# Patient Record
Sex: Female | Born: 1960 | Race: Black or African American | Hispanic: No | Marital: Single | Smoking: Former smoker
Health system: Southern US, Community
[De-identification: ages and names within clinical notes are randomized; demographics above are authoritative.]

## PROBLEM LIST (undated history)

## (undated) DIAGNOSIS — Z9889 Other specified postprocedural states: Secondary | ICD-10-CM

## (undated) DIAGNOSIS — I2699 Other pulmonary embolism without acute cor pulmonale: Secondary | ICD-10-CM

## (undated) DIAGNOSIS — D219 Benign neoplasm of connective and other soft tissue, unspecified: Secondary | ICD-10-CM

## (undated) DIAGNOSIS — R112 Nausea with vomiting, unspecified: Secondary | ICD-10-CM

## (undated) DIAGNOSIS — E785 Hyperlipidemia, unspecified: Secondary | ICD-10-CM

## (undated) DIAGNOSIS — O009 Unspecified ectopic pregnancy without intrauterine pregnancy: Secondary | ICD-10-CM

## (undated) DIAGNOSIS — I739 Peripheral vascular disease, unspecified: Secondary | ICD-10-CM

## (undated) DIAGNOSIS — I82409 Acute embolism and thrombosis of unspecified deep veins of unspecified lower extremity: Secondary | ICD-10-CM

## (undated) DIAGNOSIS — T8859XA Other complications of anesthesia, initial encounter: Secondary | ICD-10-CM

## (undated) DIAGNOSIS — K429 Umbilical hernia without obstruction or gangrene: Secondary | ICD-10-CM

## (undated) DIAGNOSIS — J189 Pneumonia, unspecified organism: Secondary | ICD-10-CM

## (undated) DIAGNOSIS — D689 Coagulation defect, unspecified: Secondary | ICD-10-CM

## (undated) DIAGNOSIS — M1990 Unspecified osteoarthritis, unspecified site: Secondary | ICD-10-CM

## (undated) HISTORY — PX: OTHER SURGICAL HISTORY: SHX169

## (undated) HISTORY — DX: Hyperlipidemia, unspecified: E78.5

## (undated) HISTORY — DX: Coagulation defect, unspecified: D68.9

## (undated) HISTORY — PX: DIAGNOSTIC LAPAROSCOPY: SUR761

## (undated) HISTORY — PX: WISDOM TOOTH EXTRACTION: SHX21

## (undated) HISTORY — PX: EYE SURGERY: SHX253

---

## 1997-07-17 ENCOUNTER — Emergency Department (HOSPITAL_COMMUNITY): Admission: EM | Admit: 1997-07-17 | Discharge: 1997-07-17 | Payer: Self-pay | Admitting: *Deleted

## 1998-01-13 HISTORY — PX: KNEE SURGERY: SHX244

## 2001-05-25 ENCOUNTER — Encounter: Payer: Self-pay | Admitting: Internal Medicine

## 2001-05-25 ENCOUNTER — Encounter: Admission: RE | Admit: 2001-05-25 | Discharge: 2001-05-25 | Payer: Self-pay | Admitting: Internal Medicine

## 2002-04-24 ENCOUNTER — Emergency Department (HOSPITAL_COMMUNITY): Admission: EM | Admit: 2002-04-24 | Discharge: 2002-04-24 | Payer: Self-pay | Admitting: Emergency Medicine

## 2002-05-28 ENCOUNTER — Ambulatory Visit (HOSPITAL_COMMUNITY): Admission: RE | Admit: 2002-05-28 | Discharge: 2002-05-28 | Payer: Self-pay | Admitting: Family Medicine

## 2002-09-05 ENCOUNTER — Encounter: Admission: RE | Admit: 2002-09-05 | Discharge: 2002-09-05 | Payer: Self-pay | Admitting: Internal Medicine

## 2002-09-05 ENCOUNTER — Encounter: Payer: Self-pay | Admitting: Internal Medicine

## 2003-08-21 ENCOUNTER — Other Ambulatory Visit: Admission: RE | Admit: 2003-08-21 | Discharge: 2003-08-21 | Payer: Self-pay | Admitting: Obstetrics & Gynecology

## 2003-09-11 ENCOUNTER — Encounter: Admission: RE | Admit: 2003-09-11 | Discharge: 2003-09-11 | Payer: Self-pay | Admitting: Obstetrics & Gynecology

## 2003-09-11 ENCOUNTER — Encounter (INDEPENDENT_AMBULATORY_CARE_PROVIDER_SITE_OTHER): Payer: Self-pay | Admitting: *Deleted

## 2004-09-18 ENCOUNTER — Other Ambulatory Visit: Admission: RE | Admit: 2004-09-18 | Discharge: 2004-09-18 | Payer: Self-pay | Admitting: Obstetrics & Gynecology

## 2004-11-12 ENCOUNTER — Ambulatory Visit: Payer: Self-pay | Admitting: Internal Medicine

## 2005-01-22 ENCOUNTER — Ambulatory Visit: Payer: Self-pay | Admitting: Internal Medicine

## 2005-06-23 ENCOUNTER — Ambulatory Visit: Payer: Self-pay | Admitting: Internal Medicine

## 2005-06-24 ENCOUNTER — Ambulatory Visit: Payer: Self-pay | Admitting: Internal Medicine

## 2005-09-05 ENCOUNTER — Ambulatory Visit: Payer: Self-pay | Admitting: Oncology

## 2005-09-10 LAB — LUPUS ANTICOAGULANT PANEL: PTT Lupus Anticoagulant: 42.2 secs (ref 36.3–48.8)

## 2005-09-10 LAB — BETA-2 GLYCOPROTEIN ANTIBODIES
Beta-2-Glycoprotein I IgA: <4 U/mL (ref <10)
Beta-2-Glycoprotein I IgM: 7 U/mL (ref <10)

## 2005-09-10 LAB — CARDIOLIPIN ANTIBODIES, IGG, IGM, IGA
Anticardiolipin IgG: <7 [GPL'U] (ref <11)
Anticardiolipin IgM: 9 [MPL'U] (ref <10)

## 2005-09-10 LAB — D-DIMER, QUANTITATIVE: D-Dimer, Quant: 1.22 ug/mL-FEU — ABNORMAL HIGH (ref 0.00–0.48)

## 2005-11-06 ENCOUNTER — Ambulatory Visit: Payer: Self-pay | Admitting: Oncology

## 2005-11-19 ENCOUNTER — Encounter: Admission: RE | Admit: 2005-11-19 | Discharge: 2005-11-19 | Payer: Self-pay | Admitting: General Surgery

## 2006-01-02 ENCOUNTER — Ambulatory Visit (HOSPITAL_BASED_OUTPATIENT_CLINIC_OR_DEPARTMENT_OTHER): Admission: RE | Admit: 2006-01-02 | Discharge: 2006-01-02 | Payer: Self-pay | Admitting: Orthopedic Surgery

## 2006-07-08 ENCOUNTER — Encounter (INDEPENDENT_AMBULATORY_CARE_PROVIDER_SITE_OTHER): Payer: Self-pay | Admitting: Orthopedic Surgery

## 2006-07-08 ENCOUNTER — Ambulatory Visit (HOSPITAL_COMMUNITY): Admission: RE | Admit: 2006-07-08 | Discharge: 2006-07-08 | Payer: Self-pay | Admitting: Orthopedic Surgery

## 2006-07-08 ENCOUNTER — Ambulatory Visit: Payer: Self-pay | Admitting: Vascular Surgery

## 2006-07-28 ENCOUNTER — Emergency Department (HOSPITAL_COMMUNITY): Admission: EM | Admit: 2006-07-28 | Discharge: 2006-07-28 | Payer: Self-pay | Admitting: Emergency Medicine

## 2006-09-05 ENCOUNTER — Ambulatory Visit: Payer: Self-pay | Admitting: Pulmonary Disease

## 2006-09-05 ENCOUNTER — Ambulatory Visit: Payer: Self-pay | Admitting: Internal Medicine

## 2006-09-05 ENCOUNTER — Inpatient Hospital Stay (HOSPITAL_COMMUNITY): Admission: EM | Admit: 2006-09-05 | Discharge: 2006-09-16 | Payer: Self-pay | Admitting: Emergency Medicine

## 2006-09-05 ENCOUNTER — Ambulatory Visit: Payer: Self-pay | Admitting: Cardiology

## 2006-09-05 DIAGNOSIS — I2699 Other pulmonary embolism without acute cor pulmonale: Secondary | ICD-10-CM

## 2006-09-07 ENCOUNTER — Encounter: Payer: Self-pay | Admitting: Internal Medicine

## 2006-09-07 ENCOUNTER — Ambulatory Visit: Payer: Self-pay | Admitting: *Deleted

## 2006-09-08 ENCOUNTER — Encounter: Payer: Self-pay | Admitting: Internal Medicine

## 2006-09-14 LAB — CONVERTED CEMR LAB: Pap Smear: NORMAL

## 2006-09-15 ENCOUNTER — Telehealth (INDEPENDENT_AMBULATORY_CARE_PROVIDER_SITE_OTHER): Payer: Self-pay | Admitting: *Deleted

## 2006-09-18 ENCOUNTER — Ambulatory Visit: Payer: Self-pay | Admitting: Internal Medicine

## 2006-09-21 ENCOUNTER — Encounter: Payer: Self-pay | Admitting: Internal Medicine

## 2006-09-21 ENCOUNTER — Ambulatory Visit: Payer: Self-pay | Admitting: Cardiology

## 2006-09-28 ENCOUNTER — Ambulatory Visit: Payer: Self-pay | Admitting: Internal Medicine

## 2006-09-30 ENCOUNTER — Encounter (INDEPENDENT_AMBULATORY_CARE_PROVIDER_SITE_OTHER): Payer: Self-pay | Admitting: *Deleted

## 2006-09-30 ENCOUNTER — Ambulatory Visit: Payer: Self-pay | Admitting: Internal Medicine

## 2006-09-30 DIAGNOSIS — E669 Obesity, unspecified: Secondary | ICD-10-CM

## 2006-09-30 DIAGNOSIS — I10 Essential (primary) hypertension: Secondary | ICD-10-CM | POA: Insufficient documentation

## 2006-09-30 DIAGNOSIS — F411 Generalized anxiety disorder: Secondary | ICD-10-CM | POA: Insufficient documentation

## 2006-09-30 LAB — CONVERTED CEMR LAB
Hemoglobin: 12.3 g/dL
INR: 2.5
Prothrombin Time: 19 s

## 2006-10-01 ENCOUNTER — Ambulatory Visit: Payer: Self-pay | Admitting: Internal Medicine

## 2006-10-02 ENCOUNTER — Telehealth (INDEPENDENT_AMBULATORY_CARE_PROVIDER_SITE_OTHER): Payer: Self-pay | Admitting: *Deleted

## 2006-10-05 ENCOUNTER — Ambulatory Visit: Payer: Self-pay | Admitting: Internal Medicine

## 2006-10-08 ENCOUNTER — Ambulatory Visit: Payer: Self-pay | Admitting: Internal Medicine

## 2006-10-08 ENCOUNTER — Encounter (INDEPENDENT_AMBULATORY_CARE_PROVIDER_SITE_OTHER): Payer: Self-pay | Admitting: *Deleted

## 2006-10-09 ENCOUNTER — Ambulatory Visit: Payer: Self-pay | Admitting: Internal Medicine

## 2006-10-12 ENCOUNTER — Encounter: Payer: Self-pay | Admitting: Internal Medicine

## 2006-10-15 ENCOUNTER — Ambulatory Visit: Payer: Self-pay | Admitting: Cardiology

## 2006-10-23 ENCOUNTER — Ambulatory Visit: Payer: Self-pay | Admitting: Cardiology

## 2006-10-29 ENCOUNTER — Ambulatory Visit: Payer: Self-pay | Admitting: Cardiology

## 2006-11-04 ENCOUNTER — Encounter: Payer: Self-pay | Admitting: Internal Medicine

## 2006-11-06 ENCOUNTER — Telehealth: Payer: Self-pay | Admitting: Internal Medicine

## 2006-11-06 ENCOUNTER — Ambulatory Visit: Payer: Self-pay | Admitting: Internal Medicine

## 2006-11-06 DIAGNOSIS — G4700 Insomnia, unspecified: Secondary | ICD-10-CM | POA: Insufficient documentation

## 2006-11-06 DIAGNOSIS — J309 Allergic rhinitis, unspecified: Secondary | ICD-10-CM | POA: Insufficient documentation

## 2006-11-12 ENCOUNTER — Ambulatory Visit: Payer: Self-pay | Admitting: Internal Medicine

## 2006-11-26 ENCOUNTER — Ambulatory Visit: Payer: Self-pay | Admitting: Internal Medicine

## 2006-11-27 LAB — CONVERTED CEMR LAB
ALT: 21 units/L (ref 0–35)
BUN: 12 mg/dL (ref 6–23)
Basophils Absolute: 0 10*3/uL (ref 0.0–0.1)
Basophils Relative: 0.1 % (ref 0.0–1.0)
Bilirubin, Direct: 0.1 mg/dL (ref 0.0–0.3)
CO2: 25 meq/L (ref 19–32)
Chloride: 108 meq/L (ref 96–112)
Creatinine, Ser: 0.8 mg/dL (ref 0.4–1.2)
Eosinophils Absolute: 0.2 10*3/uL (ref 0.0–0.6)
GFR calc Af Amer: 100 mL/min
Glucose, Bld: 81 mg/dL (ref 70–99)
Hemoglobin: 12.2 g/dL (ref 12.0–15.0)
Lipase: 18 units/L (ref 11.0–59.0)
MCHC: 34.1 g/dL (ref 30.0–36.0)
Monocytes Absolute: 0.3 10*3/uL (ref 0.2–0.7)
Monocytes Relative: 6.1 % (ref 3.0–11.0)
Neutro Abs: 2.4 10*3/uL (ref 1.4–7.7)
Potassium: 3.9 meq/L (ref 3.5–5.1)
RDW: 14.3 % (ref 11.5–14.6)
Total Bilirubin: 0.6 mg/dL (ref 0.3–1.2)
Total CHOL/HDL Ratio: 3.4
Total Protein: 7.7 g/dL (ref 6.0–8.3)

## 2006-12-03 ENCOUNTER — Ambulatory Visit: Payer: Self-pay | Admitting: Cardiology

## 2006-12-14 ENCOUNTER — Telehealth (INDEPENDENT_AMBULATORY_CARE_PROVIDER_SITE_OTHER): Payer: Self-pay | Admitting: *Deleted

## 2006-12-16 ENCOUNTER — Encounter (INDEPENDENT_AMBULATORY_CARE_PROVIDER_SITE_OTHER): Payer: Self-pay | Admitting: *Deleted

## 2007-01-01 ENCOUNTER — Telehealth: Payer: Self-pay | Admitting: Internal Medicine

## 2007-01-04 ENCOUNTER — Encounter (INDEPENDENT_AMBULATORY_CARE_PROVIDER_SITE_OTHER): Payer: Self-pay | Admitting: *Deleted

## 2007-01-18 ENCOUNTER — Ambulatory Visit: Payer: Self-pay | Admitting: Cardiology

## 2007-01-27 ENCOUNTER — Emergency Department (HOSPITAL_COMMUNITY): Admission: EM | Admit: 2007-01-27 | Discharge: 2007-01-28 | Payer: Self-pay | Admitting: Emergency Medicine

## 2007-02-08 ENCOUNTER — Ambulatory Visit: Payer: Self-pay | Admitting: Cardiology

## 2007-03-08 ENCOUNTER — Ambulatory Visit: Payer: Self-pay | Admitting: Cardiology

## 2007-03-22 ENCOUNTER — Ambulatory Visit: Payer: Self-pay | Admitting: Cardiology

## 2007-03-29 ENCOUNTER — Ambulatory Visit: Payer: Self-pay | Admitting: Internal Medicine

## 2007-04-12 ENCOUNTER — Ambulatory Visit: Payer: Self-pay | Admitting: Cardiology

## 2007-05-03 ENCOUNTER — Ambulatory Visit: Payer: Self-pay | Admitting: Cardiology

## 2007-07-27 ENCOUNTER — Ambulatory Visit: Payer: Self-pay | Admitting: Internal Medicine

## 2007-08-03 ENCOUNTER — Ambulatory Visit: Payer: Self-pay | Admitting: Cardiology

## 2007-08-18 ENCOUNTER — Ambulatory Visit: Payer: Self-pay | Admitting: Cardiology

## 2007-08-18 ENCOUNTER — Ambulatory Visit: Payer: Self-pay | Admitting: Internal Medicine

## 2007-09-03 ENCOUNTER — Ambulatory Visit: Payer: Self-pay | Admitting: Internal Medicine

## 2007-09-17 ENCOUNTER — Ambulatory Visit: Payer: Self-pay | Admitting: Cardiovascular Disease

## 2007-10-11 ENCOUNTER — Ambulatory Visit: Payer: Self-pay | Admitting: Cardiovascular Disease

## 2007-10-19 ENCOUNTER — Ambulatory Visit: Payer: Self-pay | Admitting: Cardiology

## 2007-11-04 IMAGING — CR DG CHEST 2V
2 series · 2 of 2 positions shown · non-contrast
Comparison: 09/11/06.

CLINICAL DATA: History of pulmonary embolism. Upper respiratory infection.
 CHEST - 2 VIEW:

[view not recorded (1 of 2)]
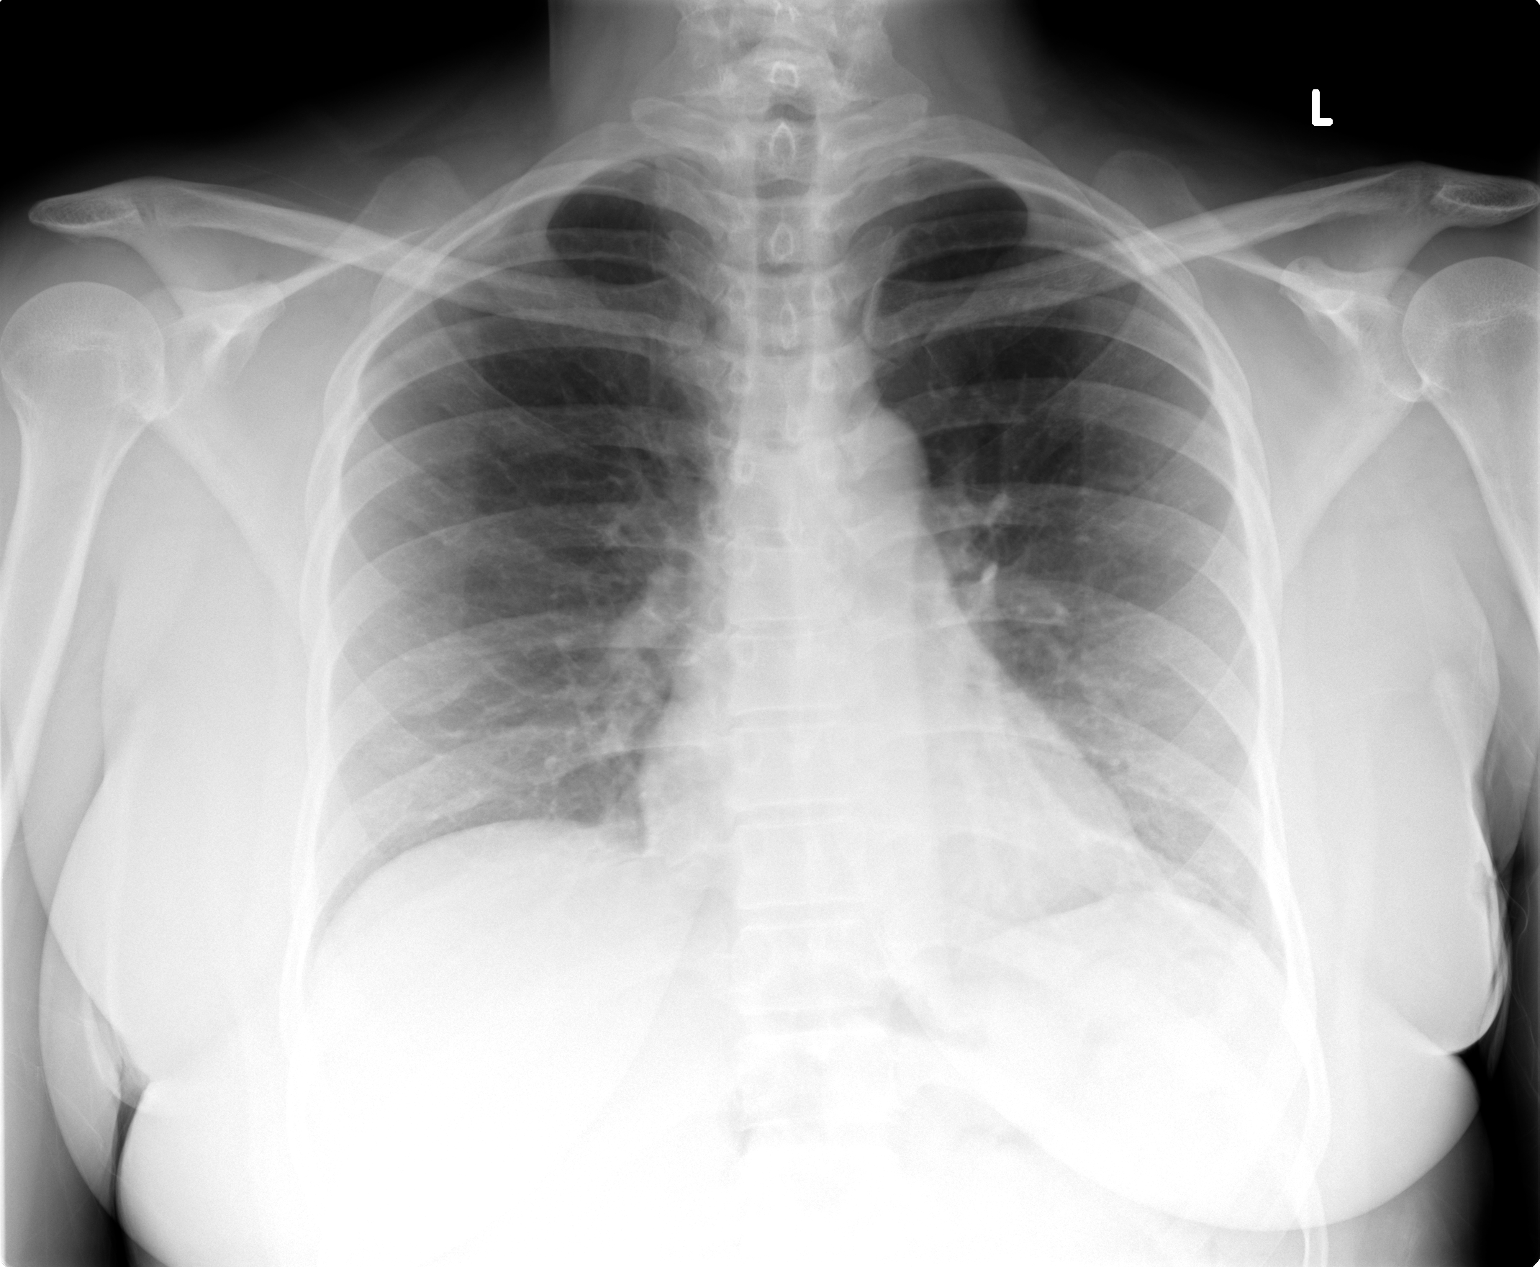

[view not recorded (2 of 2)]
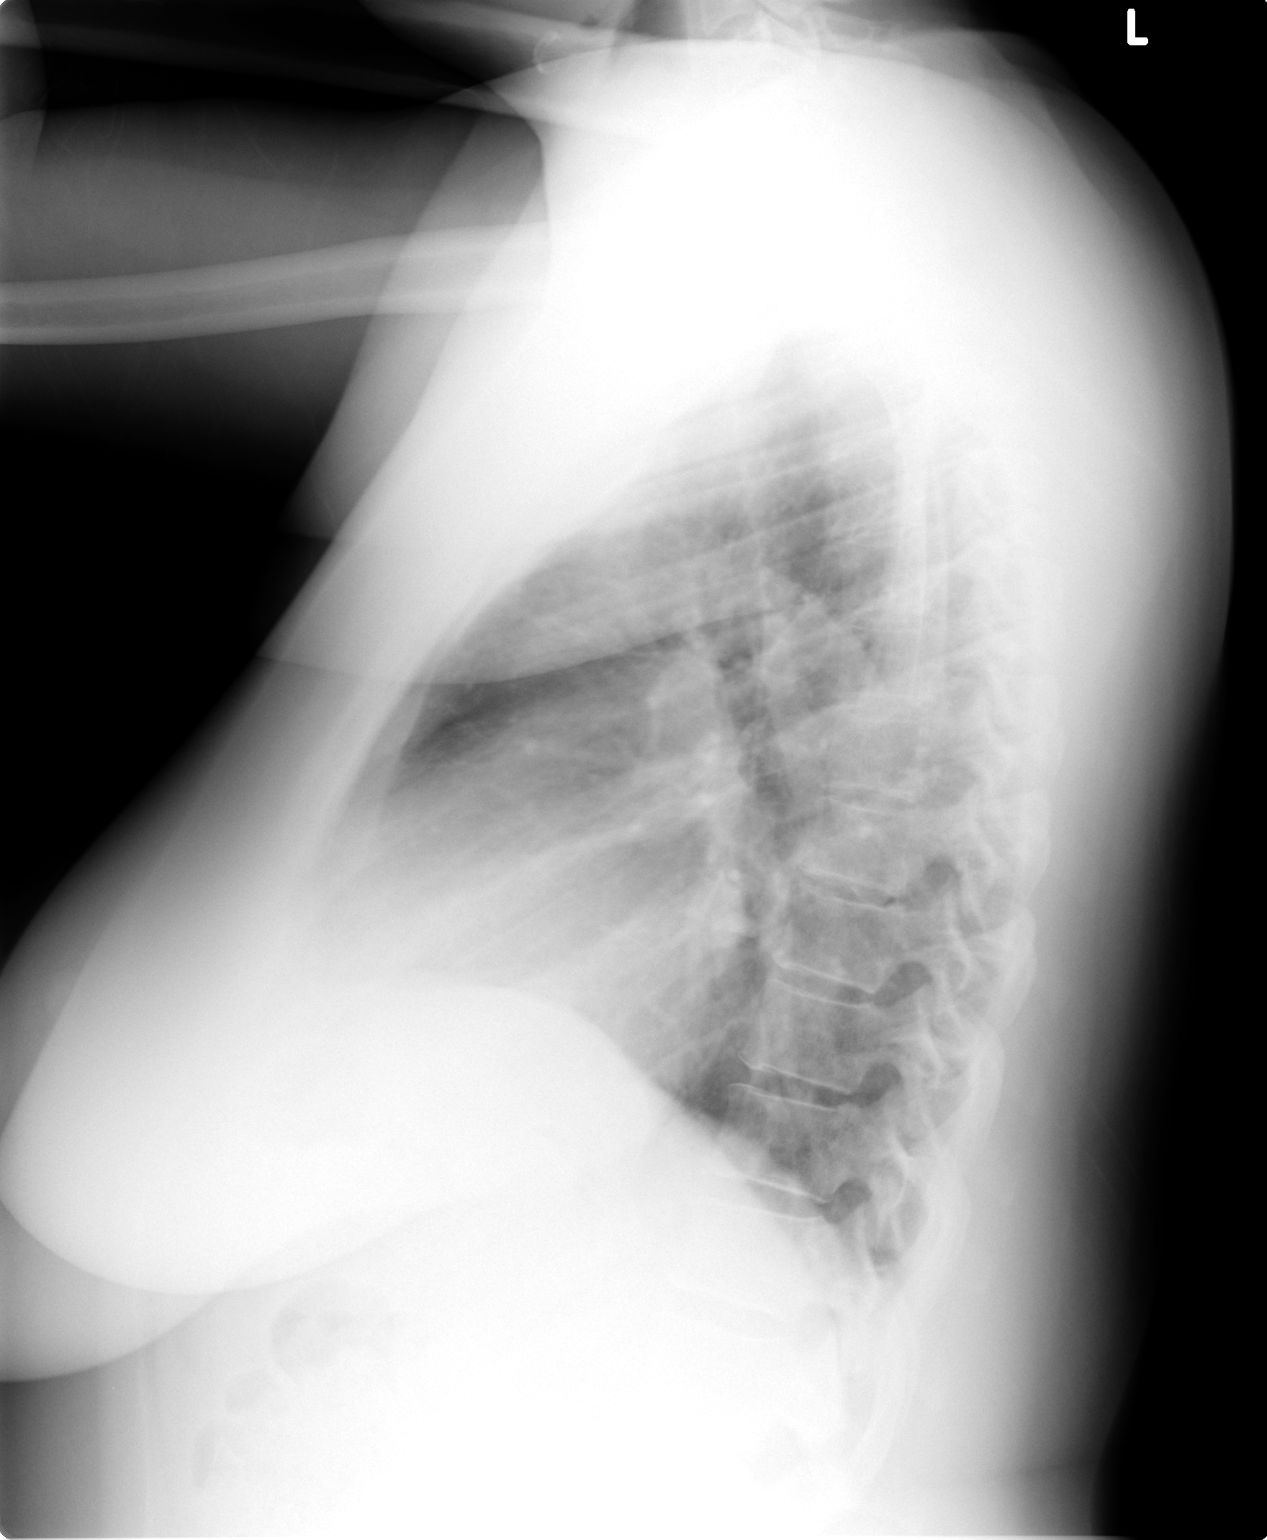

[2 of 2 positions shown; findings below may reference images not displayed]

FINDINGS: No active infiltrate or effusion is seen.  The heart is within normal limits in size. No bony abnormality is seen.
IMPRESSION: No active lung disease.

## 2007-11-15 ENCOUNTER — Ambulatory Visit: Payer: Self-pay | Admitting: Cardiovascular Disease

## 2007-12-06 ENCOUNTER — Ambulatory Visit: Payer: Self-pay | Admitting: Internal Medicine

## 2007-12-13 ENCOUNTER — Ambulatory Visit: Payer: Self-pay | Admitting: Internal Medicine

## 2007-12-16 ENCOUNTER — Ambulatory Visit: Payer: Self-pay | Admitting: Hematology and Oncology

## 2008-01-03 ENCOUNTER — Ambulatory Visit: Payer: Self-pay | Admitting: Internal Medicine

## 2008-01-06 ENCOUNTER — Encounter (INDEPENDENT_AMBULATORY_CARE_PROVIDER_SITE_OTHER): Payer: Self-pay | Admitting: *Deleted

## 2008-01-06 LAB — CONVERTED CEMR LAB
ALT: 17 units/L (ref 0–35)
AST: 23 units/L (ref 0–37)
BUN: 11 mg/dL (ref 6–23)
Basophils Relative: 1.1 % (ref 0.0–3.0)
CO2: 27 meq/L (ref 19–32)
Chloride: 107 meq/L (ref 96–112)
Cholesterol: 201 mg/dL (ref 0–200)
Direct LDL: 134.1 mg/dL
HCT: 36.8 % (ref 36.0–46.0)
Hemoglobin: 12.4 g/dL (ref 12.0–15.0)
Lymphocytes Relative: 41.7 % (ref 12.0–46.0)
MCHC: 33.6 g/dL (ref 30.0–36.0)
MCV: 91.9 fL (ref 78.0–100.0)
Monocytes Absolute: 0.3 10*3/uL (ref 0.1–1.0)
Neutro Abs: 1.7 10*3/uL (ref 1.4–7.7)
Platelets: 309 10*3/uL (ref 150–400)
Potassium: 4.3 meq/L (ref 3.5–5.1)
RBC: 4.01 M/uL (ref 3.87–5.11)
Sodium: 140 meq/L (ref 135–145)
TSH: 0.7 microintl units/mL (ref 0.35–5.50)
Triglycerides: 41 mg/dL (ref 0–149)
VLDL: 8 mg/dL (ref 0–40)

## 2008-01-10 ENCOUNTER — Ambulatory Visit: Payer: Self-pay | Admitting: Cardiology

## 2008-04-17 ENCOUNTER — Ambulatory Visit: Payer: Self-pay | Admitting: Cardiology

## 2008-05-01 ENCOUNTER — Ambulatory Visit: Payer: Self-pay | Admitting: Cardiovascular Disease

## 2008-05-22 ENCOUNTER — Ambulatory Visit: Payer: Self-pay | Admitting: Cardiovascular Disease

## 2008-05-22 ENCOUNTER — Encounter (INDEPENDENT_AMBULATORY_CARE_PROVIDER_SITE_OTHER): Payer: Self-pay | Admitting: Cardiology

## 2008-05-29 ENCOUNTER — Ambulatory Visit: Payer: Self-pay | Admitting: Cardiovascular Disease

## 2008-06-05 ENCOUNTER — Ambulatory Visit: Payer: Self-pay | Admitting: Cardiovascular Disease

## 2008-06-13 ENCOUNTER — Encounter: Payer: Self-pay | Admitting: *Deleted

## 2008-07-03 ENCOUNTER — Ambulatory Visit: Payer: Self-pay | Admitting: Internal Medicine

## 2008-07-03 LAB — CONVERTED CEMR LAB
POC INR: 1.2
Protime: 13.5

## 2008-07-19 ENCOUNTER — Encounter: Payer: Self-pay | Admitting: *Deleted

## 2008-08-24 ENCOUNTER — Encounter: Payer: Self-pay | Admitting: Cardiology

## 2008-08-29 ENCOUNTER — Encounter (INDEPENDENT_AMBULATORY_CARE_PROVIDER_SITE_OTHER): Payer: Self-pay | Admitting: *Deleted

## 2008-09-29 ENCOUNTER — Encounter (INDEPENDENT_AMBULATORY_CARE_PROVIDER_SITE_OTHER): Payer: Self-pay | Admitting: *Deleted

## 2008-10-03 ENCOUNTER — Telehealth (INDEPENDENT_AMBULATORY_CARE_PROVIDER_SITE_OTHER): Payer: Self-pay | Admitting: *Deleted

## 2008-10-09 ENCOUNTER — Ambulatory Visit: Payer: Self-pay | Admitting: Cardiology

## 2008-10-09 LAB — CONVERTED CEMR LAB: POC INR: 2.3

## 2008-10-19 ENCOUNTER — Encounter: Payer: Self-pay | Admitting: Internal Medicine

## 2009-10-22 ENCOUNTER — Telehealth: Payer: Self-pay | Admitting: Internal Medicine

## 2009-11-05 ENCOUNTER — Encounter (INDEPENDENT_AMBULATORY_CARE_PROVIDER_SITE_OTHER): Payer: Self-pay | Admitting: *Deleted

## 2009-11-05 ENCOUNTER — Ambulatory Visit: Payer: Self-pay | Admitting: Internal Medicine

## 2009-11-05 DIAGNOSIS — L989 Disorder of the skin and subcutaneous tissue, unspecified: Secondary | ICD-10-CM | POA: Insufficient documentation

## 2009-11-12 LAB — CONVERTED CEMR LAB
ALT: 25 units/L (ref 0–35)
Basophils Absolute: 0 10*3/uL (ref 0.0–0.1)
Bilirubin, Direct: 0.2 mg/dL (ref 0.0–0.3)
CO2: 23 meq/L (ref 19–32)
Calcium: 9 mg/dL (ref 8.4–10.5)
Chloride: 106 meq/L (ref 96–112)
Eosinophils Relative: 4.8 % (ref 0.0–5.0)
HDL: 77.2 mg/dL (ref >39.00)
MCV: 96.3 fL (ref 78.0–100.0)
Monocytes Absolute: 0.2 10*3/uL (ref 0.1–1.0)
Monocytes Relative: 5 % (ref 3.0–12.0)
Neutro Abs: 2.1 10*3/uL (ref 1.4–7.7)
Potassium: 4.2 meq/L (ref 3.5–5.1)
RBC: 3.7 M/uL — ABNORMAL LOW (ref 3.87–5.11)
RDW: 14.6 % (ref 11.5–14.6)
TSH: 1.17 microintl units/mL (ref 0.35–5.50)
Total CHOL/HDL Ratio: 3
Total Protein: 7.5 g/dL (ref 6.0–8.3)
Triglycerides: 29 mg/dL (ref 0.0–149.0)
VLDL: 5.8 mg/dL (ref 0.0–40.0)
WBC: 4.5 10*3/uL (ref 4.5–10.5)

## 2009-11-23 ENCOUNTER — Ambulatory Visit: Payer: Self-pay | Admitting: Hematology & Oncology

## 2009-11-26 ENCOUNTER — Encounter: Payer: Self-pay | Admitting: Internal Medicine

## 2009-11-26 LAB — CBC WITH DIFFERENTIAL (CANCER CENTER ONLY)
BASO%: 0.5 % (ref 0.0–2.0)
EOS%: 4.4 % (ref 0.0–7.0)
HCT: 36.6 % (ref 34.8–46.6)
LYMPH#: 2.1 10*3/uL (ref 0.9–3.3)
LYMPH%: 47 % (ref 14.0–48.0)
MCH: 31.1 pg (ref 26.0–34.0)
MONO#: 0.2 10*3/uL (ref 0.1–0.9)
MONO%: 4 % (ref 0.0–13.0)
NEUT#: 2 10*3/uL (ref 1.5–6.5)
RBC: 3.95 10*6/uL (ref 3.70–5.32)

## 2009-12-03 LAB — HYPERCOAGULABLE PANEL, COMPREHENSIVE
Anticardiolipin IgA: 3 APL U/mL (ref <22)
Anticardiolipin IgG: 3 GPL U/mL (ref <23)
Beta-2-Glycoprotein I IgA: 1 A Units (ref <20)
Beta-2-Glycoprotein I IgM: 2 M Units (ref <20)
DRVVT: 40.9 secs (ref 36.2–44.3)
Protein C Activity: 135 % — ABNORMAL HIGH (ref 75–133)
Protein S Activity: 86 % (ref 69–129)

## 2010-02-02 ENCOUNTER — Encounter: Payer: Self-pay | Admitting: Internal Medicine

## 2010-02-14 NOTE — Progress Notes (Signed)
Summary: stopping ASA  Phone Note Call from Patient Call back at [phone] ext [phone]   Caller: [facility]  Summary of Call: Pt is having a Vitrecetomy on Thursday okay to stop ASA?  Faxed order to [phone]. Pt has not been seen since 2009.  Initial call taken by: Army Fossa CMA,  October 22, 2009 11:08 AM  Follow-up for Phone Call        not seen in almost 2 years; I can't answer that Lacassine E. Paz MD  October 22, 2009 1:04 PM   Additional Follow-up for Phone Call Additional follow up Details #1::        Left detailed message for [facility] at Surgical center. Additional Follow-up by: Army Fossa CMA,  October 22, 2009 1:20 PM

## 2010-02-14 NOTE — Consult Note (Signed)
Summary: ok off coumadin   , life long anticoag if another event--hematol  [facility]   Imported By: Lanelle Bal 12/11/2009 12:35:04  _____________________________________________________________________  External Attachment:    Type:   Image     Comment:   External Document

## 2010-02-14 NOTE — Letter (Signed)
Summary: Generic Letter  [facility]  [address] [place], [place] [address]   Phone: [phone]  Fax: [phone]    11/05/2009  [facility] [address] [name], [place]  [address]  To whom it may Concern:  The above patient was here today, November 05, 2009 for her annual complete physical exam. Any further questions please feel free to contact my office.            Sincerely,   Willow Ora, MD

## 2010-02-14 NOTE — Assessment & Plan Note (Signed)
Summary: cpx/cbs   Vital Signs:  Patient profile:   50 year old female Height:      65.75 inches Weight:      228.25 pounds BMI:     37.26 Pulse rate:   90 / minute Pulse rhythm:   regular BP sitting:   128 / 80  (left arm) Cuff size:   large  Vitals Entered By: Army Fossa CMA (November 05, 2009 1:57 PM) CC: CPX, fasting  Comments declines flu shot pharm- [facility] [place] mammo/pap- due    History of Present Illness: CPX  Preventive Screening-Counseling & Management  Caffeine-Diet-Exercise     Does Patient Exercise: no  Current Medications (verified): 1)  Mvi 2)  Bufferin 325 Mg Tabs (Aspirin Buf(Cacarb-Mgcarb-Mgo)) .... Qd 3)  Zylet 0.5-0.3 % Susp (Loteprednol-Tobramycin) .... Three Times A Day  Allergies (verified): 1)  ! 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 CV:  Denies chest pain or discomfort and swelling of feet. Resp:  Denies cough and shortness of breath. GI:  Denies bloody stools, diarrhea, nausea, and vomiting. GU:  Denies dysuria and hematuria. Psych:  Denies anxiety and depression.  Physical Exam  General:  alert, well-developed, and overweight-appearing.   Neck:  no masses, no thyromegaly, and normal carotid upstroke.   Lungs:  normal respiratory  effort, no intercostal retractions, no accessory muscle use, and normal breath sounds.   Heart:  normal rate, regular rhythm, and no murmur.   Abdomen:  soft, non-tender, no distention, no masses, no guarding, and no rigidity.   Extremities:  no  lower extremity edema Neurologic:  alert & oriented X3, strength normal in all extremities, and gait normal.   Skin:  has a half centimeter oval, slightly raised  and hyperchromic lesion in the left leg. Psych:  Oriented X3, not anxious appearing, and not depressed appearing.     Impression & Recommendations:  Problem # 1:  HEALTH SCREENING (ICD-V70.0) Td 08 declined flu shot   sees gyn, PAP, MMG--- per gyn   never Cscope  labs , diet, exercise discussed    Orders: Venipuncture (04540) TLB-BMP (Basic Metabolic Panel-BMET) (80048-METABOL) TLB-CBC Platelet - w/Differential (85025-CBCD) TLB-Hepatic/Liver Function Pnl (80076-HEPATIC) TLB-Lipid Panel (80061-LIPID) TLB-TSH (Thyroid Stimulating Hormone) (84443-TSH) Specimen Handling (98119) Hematology Referral (Hematology)  Problem # 2:  PE (ICD-415.19)  history of PE after bed rest Strong family history of clots  She was prescribed Coumadin but self discontinued due to several symptoms that she felt were related to Coumadin Patient wonders  if she needs coumadin again, we agreed to refer her to hematology for consultation  Her updated medication list for this problem includes:    Bufferin 325 Mg Tabs (Aspirin buf(cacarb-mgcarb-mgo)) ..... Qd  Orders: Hematology Referral (Hematology)  Problem # 3:  SKIN LESION (ICD-709.9) skin lesion in the left leg, stable per patient  rec to call if any change in color , size or irritation     Complete Medication List: 1)  Mvi  2)  Bufferin 325 Mg Tabs (Aspirin buf(cacarb-mgcarb-mgo)) .... Qd 3)  Zylet 0.5-0.3 % Susp (Loteprednol-tobramycin) .... Three times a day  Other Orders: Admin 1st Vaccine (45409) Flu Vaccine 65yrs +  (81191)  Patient Instructions: 1)  Please schedule a follow-up appointment in 1 year.  2)  please see your gynecologist   yearly   Orders Added: 1)  Venipuncture [36415] 2)  TLB-BMP (Basic Metabolic Panel-BMET) [80048-METABOL] 3)  TLB-CBC Platelet - w/Differential [85025-CBCD] 4)  TLB-Hepatic/Liver Function Pnl [80076-HEPATIC] 5)  TLB-Lipid Panel [80061-LIPID] 6)  TLB-TSH (Thyroid Stimulating Hormone) [84443-TSH] 7)  Specimen Handling [99000] 8)  Admin 1st Vaccine [90471] 9)  Flu Vaccine 48yrs + [90658] 10)  Est. Patient age 64-64 [phone])  Hematology Referral [Hematology]     Risk Factors:  Exercise:  no Flu Vaccine Consent Questions     Do you have a history of severe allergic reactions to this vaccine? no    Any prior history of allergic reactions to egg and/or gelatin? no    Do you have a sensitivity to the preservative Thimersol? no    Do you have a past history of Guillan-Barre Syndrome? no    Do you currently have an acute febrile illness? no    Have you ever had a severe reaction to latex? no    Vaccine information given and explained to patient? yes    Are you currently pregnant? no    Lot Number:[id number]   Exp Date:07/13/2010   Site Given  Right Deltoid IM:  no      .lbflu

## 2010-05-28 NOTE — Assessment & Plan Note (Signed)
[facility]                             PULMONARY OFFICE NOTE   NAME:SUMMERSMelodee, [name]                      MRN:          [id number]  DATE:10/09/2006                            DOB:          12/04/1960    REFERRING PHYSICIAN:  Willow Ora, MD   REASON FOR CONSULTATION:  Dyspnea.   HISTORY:  This is a very complicated 50 year old white female with  morbid obesity complicated by pulmonary embolism after undergoing ankle  surgery in July.  She has already been referred to [facility] for a  hypercoagulable workup, after her workup here was negative and with a  strong family history as outlined below.  The question for today is why  she continues to be short of breath. She states actually that her  breathing is slightly better than it was on discharge but is concerned  that she not making more progress.  She says she can get short of  breath just talking too much but on the other hand has noticed  increasing activity tolerance to the point where she can walk at a slow  pace from room to room easily, where previously she could not do this.  She is concerned that she has not been able to walk outside any  significant length yet since discharge.   Presently, she denies any exertional or pleuritic chest pain, fevers,  chills, sweats, orthopnea, PND, or leg swelling.   PAST MEDICAL HISTORY:  Significant for:  1. Blood clots as noted.  2. She has also had 2 ectopic pregnancies.   ALLERGIES:  LYRICA.   MEDICATIONS:  Coumadin and vitamins and lorazepam p.r.n.   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   FAMILY HISTORY:  Significant for asthma in mother and sister.   REVIEW OF SYSTEMS:  Taken in detail on the worksheet, negative except as  outlined above.   PHYSICAL EXAMINATION:  GENERAL:  This is an anxious, ambulatory, obese  female in no acute  distress.  VITAL SIGNS:  She is afebrile with normal vital signs.  HEENT:  Unremarkable.  Oropharynx is clear.  NECK:  Supple without cervical adenopathy or tenderness.  Trachea is  midline.  No thyromegaly.  LUNGS:  Lung fields are perfectly clear bilaterally on auscultation  percussion.  HEART:  There is a regular rhythm without murmur, gallop, or rub  present.  ABDOMEN:  Soft, benign.  EXTREMITIES:  Warm without calf tenderness, cyanosis, clubbing, edema.   Echocardiogram dated August 26th, does show mild right ventricular  enlargement and a CT scan shows bilateral pulmonary emboli in a saddle  position with improvement from the original CT scan from August 23 to  follow up on September 2nd.  No associated significant effusions.   IMPRESSION:  1. Progressive improvement in activity tolerance status post pulmonary      embolism in August. This is perfectly consistent with pulmonary      embolism that is resolving normally.  Note she did have very mild  elevation in right heart pressure by echocardiogram and unless she      is feeling completely back to baseline in 6 months, the      echocardiogram will need to be repeated, especially in the context      of considering stopping Coumadin at that point (depending on what      the hypercoagulable workup reveals and how she does with weight      loss in the meantime).  2. The paroxysms of dyspnea that occur while talking are more      consistent with reflux and I note she is using quite a bit of mint      for reasons that are not clear.  I recommend that she avoid mint,      menthol, and cough drops and use Prevacid empirically at a dose of      30 mg 30 minutes before meals perfectly regularly for the next 2      weeks to see to what extent this improves her dyspnea.  If she is      gradually improving in her symptoms with talking resolved on the      above regimen, there is no reason to see her back until 6 months.       Otherwise, we will see her back in 2 weeks.     Charlaine Dalton. Casey Sires, MD, [facility]  Electronically Signed    MBW/MedQ  DD: 10/11/2006  DT: 10/11/2006  Job #: [id number]   cc:   Willow Ora, MD

## 2010-05-28 NOTE — Consult Note (Signed)
NAMEJURLINE, FOLGER               ACCOUNT NO.:  [id number]   MEDICAL RECORD NO.:  [id number]          PATIENT TYPE:  INP   LOCATION:  1237                         [facility]ACILITY[facility]  [facility]   PHYSICIAN:  Barbaraann Share, MD,FCCPDATE OF BIRTH:  [birth year]62   DATE OF CONSULTATION:  DATE OF DISCHARGE:                                 CONSULTATION   HISTORY OF PRESENT ILLNESS:  The patient is a 50 year old female, who I  have been asked to see for bilateral pulmonary emboli.  The patient had  the sudden onset of shortness of breath and chest pain approximately 2  days ago and came to the emergency room, where her CT scan showed  extensive bilateral pulmonary emboli.  Of note, the patient had knee  surgery in January of this year and recently had an ankle/fracture,  approximately 2 to 3 weeks ago.  She has not been moving as much as she  normally does, but has been at least mobile.  The patient does have a  fairly strong family history for thromboembolic disease, but the family  member states they have all tested negative for genetic  predisposition.  Currently, the patient has excellent O2 saturations on  3 liters and minimal increased work of breathing.  She is also  hemodynamically stable.   PAST MEDICAL HISTORY:  1. Hypertension.  2. Morbid obesity.  3. History of knee surgery in January and recent foot issue.   ALLERGIES:  THE PATIENT HAS NO KNOWN DRUG ALLERGIES.   SOCIAL HISTORY:  She does not smoke.   REVIEW OF SYSTEMS:  As per history of present illness.  Also see the  documentation in the chart.   FAMILY HISTORY:  Is remarkable for thromboembolic disease, as stated per  history of present illness.   PHYSICAL EXAM:  GENERAL:  She is a morbidly obese black female, in no  acute distress.  VITAL SIGNS:  Blood pressure is 112/60, pulse 72, temperature is 98.6,  O2 sat on 3 liters is 99%.  HEENT:  Pupils equal, round, reactive to light and accommodation.  Extraocular movements are  intact.  Nares are patent without  discharge.  Oropharynx is clear.  NECK:  Supple without JVD or lymphadenopathy.  No palpable thyromegaly.  CHEST:  Reveals decreased breath sounds in the bases, but otherwise is  clear.  CARDIAC:  Reveals regular rate and rhythm.  ABDOMEN:  Soft, nontender with good bowel sounds.  GENITAL, RECTAL AND BREASTS:  Exams not done and not indicated.  LOWER EXTREMITIES:  Shows 1 to 2+ plus edema bilaterally with definite  calf tenderness.  NEUROLOGICALLY:  She is alert and oriented with no obvious motor  deficits.   IMPRESSION:  1. Bilateral pulmonary emboli, that is probably related to her recent      orthopedic issues.  The patient does have a extensive clot burden      by chest CT but has excellent O2 saturations on low flow oxygen and      is hemodynamically stable.  There is no indication at this time for      thrombolytic therapy.  I  would continue on heparin and go ahead and      initiate Coumadin.   SUGGESTIONS:  1. Agree with echo to look and see if the patient has extensive      pulmonary hypertension, as well as lower extremity Dopplers to look      at clot burden in the legs.  2. Start Coumadin.  3. Would keep the patient volume loaded with her blood pressure being      a little soft, and given the extensive nature of her clot.      Barbaraann Share, MD,FCCP  Electronically Signed     KMC/MEDQ  D:  09/06/2006  T:  09/06/2006  Job:  [id number]   cc:   Georgina Quint. Plotnikov, MD  [address]  [place]  [place] [address]

## 2010-05-28 NOTE — H&P (Signed)
[name], [name]               ACCOUNT NO.:  [id number]   MEDICAL RECORD NO.:  [id number]          PATIENT TYPE:  INP   LOCATION:  1237                         FACILITY:  [facility]   PHYSICIAN:  Deirdre Peer. Polite, M.D. DATE OF BIRTH:  1960/11/27   DATE OF ADMISSION:  09/05/2006  DATE OF DISCHARGE:                              HISTORY & PHYSICAL   CHIEF COMPLAINT:  Shortness of breath and chest pain.   HISTORY OF PRESENT ILLNESS:  A 50 year old female who recently had ankle  fracture on July 28, 2006. Had a cast on for approximately six weeks.  Yesterday the patient had acute onset of chest pain and shortness of  breath. It seemed to coincide when she took her Lyrica. The patient had  so much pain that she was unable to get up. The patient went to Urgent  Care and ultimately was sent to the E. D. In the E. D. she was  evaluated. Patient ultimately had a CAT scan which showed bilateral PE  as well as a sentinel embolism. The patient has been started on IV  heparin. At this time vitals are stable. She is still somewhat  tachycardic at approximately 111. Not having significant pain at this  time. BP is 111/58.   The patient states that she has been having some pain in that leg for  some time, called her doctors and because of that they though she may  have had some nerve damage and prescribed her Lyrica. Admission is  deemed necessary for further evaluation and treatment.   Please note that patient did have a family history of a hypocoagulable  state and there are several family members that have had clots, one  brother with a PE, two sisters and another brother with DVTs, mother  with a PE.   PAST MEDICAL HISTORY:  As stated above.   MEDICATIONS:  Lyrica which she has taken x 1.   SOCIAL HISTORY:  Negative for tobacco, alcohol or drugs.   PAST SURGICAL HISTORY:  None however she recently had a left leg cast  secondary to a fracture.   ALLERGIES:  None.   FAMILY HISTORY:  As  stated above.   REVIEW OF SYSTEMS:  In the HPI.   PHYSICAL EXAMINATION:  GENERAL: The patient has minimal stress.  VITAL SIGNS: Temp 97.3. BP 111/58. Pulse 102. Respiratory rate 24.  HEENT: Unremarkable.  CHEST: Clear without rales or rhonchi.  CARDIOVASCULAR: Regular. No S3.  ABDOMEN: Soft, nontender.  EXTREMITIES: No edema on the right. There is some calf pain on the left.  Does have pain in her left ankle secondary to the fracture. She still  has a soft brace on that left ankle. Cannot definitely palpate a cord on  the posterior popliteal area.  NEUROLOGIC: Essentially nonfocal.   ASSESSMENT:  1. Bilateral PE thromboembolism.  2. Left ankle fracture July 15 status post cast x six weeks.  3. Obesity.   RECOMMENDATIONS:  Patient to be admitted to the intensive care unit for  IV heparin and Coumadin. Will obtain bilateral ultrasound of the lower  extremity. Will discuss with  pulmonary if they feel lytics are  indicated.  The patient has a saddle embolism.  Will obtain a  hypocoagulable evaluation.      Deirdre Peer. Polite, M.D.  Electronically Signed     RDP/MEDQ  D:  09/05/2006  T:  09/06/2006  Job:  [id number]   cc:   Willow Ora, MD  [phone] [place], [place] [address]

## 2010-05-28 NOTE — Discharge Summary (Signed)
[name], [name]               ACCOUNT NO.:  [id number]   MEDICAL RECORD NO.:  [id number]          PATIENT TYPE:  INP   LOCATION:  1444                         FACILITY:  [facility]   PHYSICIAN:  Rosalyn Gess. Norins, MD  DATE OF BIRTH:  1961-01-10   DATE OF ADMISSION:  09/05/2006  DATE OF DISCHARGE:  09/16/2006                               DISCHARGE SUMMARY   ADMITTING DIAGNOSES:  1. Shortness of breath with bilateral pulmonary embolism.  2. Left ankle fracture July 15.    2008.DISCHARGE DIAGNOSES:  1. Shortness of breath with bilateral pulmonary embolism.  2. Left ankle fracture July 15.   CONSULTANTS:  Dr. Marcelyn Bruins for Pulmonary.   PROCEDURES:  1. CT angio performed the day of admission which revealed the patient      to have bilateral large pulmonary emboli and a saddle embolus.  2. Chest x-ray date of admission which showed no acute abnormality.  3. Chest x-ray September 11, 2006 which showed mild cardiomegaly with no      active disease.  4. Followup CT angio September 15, 2006 which revealed persistent      pulmonary emboli, but significantly decreased clot burden with no      other abnormalities except for very small pleural effusions.   HISTORY OF PRESENT ILLNESS:  The patient is a 50 year old African  American woman who sustained a fracture of her ankle on July 28, 2006.  She was casted for approximately 6 weeks.  On the day prior to  admission, she had the acute onset of chest pain and shortness of  breath.  She reports the pain was disabling.  She went to Urgent Care  and then was referred to the emergency department.  In the emergency  department, CT scan with angio revealed bilateral PE with a saddle  embolus.  The patient, at that time, was admitted and started on IV  heparin.   Please see H&P for past medical history, social history.   FAMILY HISTORY:  Is remarkable for multiple probands with pulmonary  emboli including her mother, her brother, her sister and  second-degree  relatives.  Of note, the patient had been seen at the cancer center  along with her other family members for screening for familial clotting  disorders and, by the patient's report, these tests were normal.   HOSPITAL COURSE:  1. Thrombosis.  Patient with multiple pulmonary emboli.  She was      initially treated in the intensive care unit because of her      unstable condition.  She was started on heparin and started on      Coumadin.  The patient had a slow recovery with persistent      pleuritic-type chest pain and shortness of breath.  She eventually      was able to be transferred to a regular hospital floor where she      continued her convalescence and anticoagulation.  She was slow to      reach therapeutic levels in regards to her INR.  The patient had      persistent shortness of  breath and pleuritic chest pain, although      this did improve over the course of her hospital stay.  On "Sunday,      the 31st, the patient did have a long day with visitors and had      increased chest pain, discomfort, and shortness of breath.  Follow-      up CT angio was unremarkable as noted.   The patient's evaluation during hospital included lower extremity venous  Doppler that was negative for any clot.  Antithrombin III level was  normal.  Protein S and protein C levels were normal.  Factor V Leiden  was negative.  Lupus anticoagulant was negative.   With the patient's continued improvement in regards to symptoms, with  follow-up CT angio showing reduced clot burden and no new PE, with her  INR being therapeutic, she is now felt to be stable and ready for  discharge home.  At this point, she will need to convalesce an  additional 7-14 days in regards to both recovering her strength from her  PE as well as problems with her fractured ankle.   The patient will be scheduled to be seen in the [facility] for routine follow-up with first visit on September  5.   The patient will return to Dr. Jose Paz at [facility]  [place] in approximately 7-10 days for follow-up evaluation and  clearance for return to work.   The patient has been referred to Dr. Stephen Moll at the Division of  Hematology Department of Medicine, [employer] [place].  The patient has been provided with the scheduler's phone number and will  schedule her own appointment.  I suspect her whole family should be  evaluated given her significant family history for hypercoagulability  and PE.   1. Cystitis.  The patient developed a significant hematuria.  It was      thought she had cystitis with suprapubic pain and discomfort.  She      never spiked much of a fever.  She was treated with ciprofloxacin      25" 0 mg b.i.d. for 5 days.  She was treated with __________ .  The      patient continued to have hematuria and some mild discomfort.  Plan      is for the patient to followup with Dr. Willow Ora.  If she continues      to have hematuria, she would need outpatient urology evaluation.      Of note, the patient had no symptoms to suggest any      nephrolithiasis.   DISCHARGE EXAMINATION:  Temperature was 98.9, blood pressure 118/67,  heart rate 64, respirations were 16, O2 sats 96% on room air.  GENERAL APPEARANCE:  This is a heavy-set African American woman in no  acute distress.  HEENT EXAM:  Was unremarkable.  CHEST:  Was clear with no rales, wheezes or rhonchi.  No increased work  of breathing was noted.  CARDIOVASCULAR:  2+ radial pulse.  She had a  quiet precordium with a regular rate and rhythm without murmurs, rubs or  gallops.  No further examination conducted.   FINAL LABORATORY:  INR was 2.3 on the day of discharge.  Final CBC 08/31  with a hemoglobin of 10.3 grams, white count was 4,400 with a normal  differential, coag labs as noted above.  Final chemistry panel from  September 07, 2006 with a sodium of 134,  potassium 4.6,  chloride of 104,  CO2 of 25, BUN of 12, creatinine 0.98, glucose was 92.  Homocystine was  normal at 10.5.  Cardiolipin antibody was less than 7.   DISCHARGE MEDICATIONS:  1. Coumadin as directed at 12.5 mg daily until further notice.  2. Ativan 0.5 mg q.6 hours p.r.n.  3. Over-the-counter loratadine 10 mg daily for a cold.  4. Over-the-counter __________  as needed.   DISPOSITION:  The patient is discharged home as noted.  Her condition at  time of discharge is stable and improved.      Rosalyn Gess Norins, MD  Electronically Signed     MEN/MEDQ  D:  09/16/2006  T:  09/16/2006  Job:  [id number]   cc:   Jeannett Senior [address]., Division of Hematology Dept of Medicine, [facility]  of [facility] [place], [place], [place]

## 2010-05-31 NOTE — Op Note (Signed)
Hood, Casey               ACCOUNT NO.:  [id number]   MEDICAL RECORD NO.:  [id number]          PATIENT TYPE:  AMB   LOCATION:  DSC                          FACILITY:  [facility]   PHYSICIAN:  Harvie Junior, M.D.   DATE OF BIRTH:  [birth year]   DATE OF PROCEDURE:  01/02/2006  DATE OF DISCHARGE:                               OPERATIVE REPORT   PREOPERATIVE DIAGNOSIS:  Medial meniscal tear.   POSTOPERATIVE DIAGNOSIS:  1. Medial meniscal tear.  2. Chondromalacia, mild, of the patella.   PRINCIPAL PROCEDURE:  1. Partial midbody and posterior horn medial meniscectomy.  2. Debridement of mild chondromalacia of the patella.   SURGEON:  Harvie Junior, M.D.   ASSISTANT:  Marshia Ly, P.A.   ANESTHESIA:  General.   BRIEF HISTORY:  [name] is a 49 year old female with a long history  of having had significant right knee pain.  She ultimately would have  been evaluated multiple times and after failure of conservative care she  is ultimately taken to the operating room for operative knee  arthroscopy.   PROCEDURE:  The patient was taken to the operating room.  After adequate  anesthesia was obtained with general anesthetic, the patient was placed  supine on the operating table.  The right leg was prepped and draped in  the usual sterile fashion.  Following this routine arthroscopic  examination of the knee revealed there was obvious midbody meniscal tear  with thickening of the meniscus and the probe easily went into this  area.  This was debrided back to a smooth and stable rim.  The posterior  horn was loose as well.  This was debrided.  There was a good 6-7 mm of  meniscus left in the posterior area.  In the midbody it was down to  probably 3.  There was some anterior meniscal tearing, which was  debrided as well.  The ACL was normal.  Lateral side normal.  Patellofemoral joint had some minimal changes, which were debrided.   At this point the knee was copiously irrigated  and suctioned dry.  The  arthroscopic portal was closed with a bandage.  A sterile, compressive  dressing was applied.  The patient was taken to recovery.  She was noted  to be in satisfactory condition.   ESTIMATED BLOOD LOSS:  Estimated blood loss for the procedure was none.      Harvie Junior, M.D.  Electronically Signed     JLG/MEDQ  D:  01/02/2006  T:  01/03/2006  Job:  [id number]

## 2010-10-03 LAB — DIFFERENTIAL
Basophils Absolute: 0.1
Basophils Relative: 1
Lymphocytes Relative: 32
Neutro Abs: 3.3
Neutrophils Relative %: 54

## 2010-10-03 LAB — BASIC METABOLIC PANEL
CO2: 23
Calcium: 8.4
Creatinine, Ser: 0.81
GFR calc non Af Amer: >60
Glucose, Bld: 92

## 2010-10-03 LAB — D-DIMER, QUANTITATIVE: D-Dimer, Quant: <0.22

## 2010-10-03 LAB — POCT CARDIAC MARKERS
CKMB, poc: 1.7
CKMB, poc: <1 — ABNORMAL LOW
Myoglobin, poc: 67.5
Troponin i, poc: <0.05

## 2010-10-03 LAB — CBC
MCHC: 33.8
Platelets: 316
RDW: 14.4

## 2010-10-03 LAB — PROTIME-INR
INR: 2.5 — ABNORMAL HIGH
Prothrombin Time: 28.1 — ABNORMAL HIGH

## 2010-10-16 ENCOUNTER — Telehealth: Payer: Self-pay | Admitting: *Deleted

## 2010-10-16 DIAGNOSIS — I2699 Other pulmonary embolism without acute cor pulmonale: Secondary | ICD-10-CM

## 2010-10-16 NOTE — Telephone Encounter (Signed)
I am very sorry about her situation but unfortunately she must take Coumadin and INR needs to be to checked

## 2010-10-16 NOTE — Telephone Encounter (Signed)
Pt states that she was just recently hospitalized for blood clot in lungs and legs. Pt is currently taking coumadin and having routine PT/INR. Pt indicated that she has recently been laid off from her job and is having difficulty affording her PT/INR visit. Pt wants to know if Dr Drue Novel is aware of a cheaper way to go about getting her PT/ INR check. Please advise

## 2010-10-18 NOTE — Telephone Encounter (Signed)
Dr.Paz, this patient would like to know if she can have blood dran in lab vs nurse visit (where she has to pay co-pay)

## 2010-10-21 NOTE — Telephone Encounter (Signed)
We can do that until her situation stabilize, schedule an INR

## 2010-10-21 NOTE — Telephone Encounter (Signed)
Lab order for PT/INR placed. LMOM for patient to return call to schedule Lab appointment.

## 2010-10-22 ENCOUNTER — Other Ambulatory Visit: Payer: Self-pay | Admitting: Internal Medicine

## 2010-10-22 DIAGNOSIS — Z79899 Other long term (current) drug therapy: Secondary | ICD-10-CM

## 2010-10-22 NOTE — Telephone Encounter (Signed)
Lab appointment made for 10/23/10 at 8:15am for INR.

## 2010-10-23 ENCOUNTER — Ambulatory Visit (INDEPENDENT_AMBULATORY_CARE_PROVIDER_SITE_OTHER): Payer: Self-pay

## 2010-10-23 DIAGNOSIS — Z79899 Other long term (current) drug therapy: Secondary | ICD-10-CM

## 2010-10-23 LAB — PROTIME-INR
INR: 1.8 ratio — ABNORMAL HIGH (ref 0.8–1.0)
Prothrombin Time: 19.5 s — ABNORMAL HIGH (ref 10.2–12.4)

## 2010-10-24 ENCOUNTER — Telehealth: Payer: Self-pay | Admitting: Internal Medicine

## 2010-10-24 NOTE — Telephone Encounter (Signed)
Please call patient, dose of Coumadin?, compliance?, for how long has she been in that dose?Marland Kitchen

## 2010-10-25 LAB — CBC
HCT: 29.5 — ABNORMAL LOW
HCT: 29.7 — ABNORMAL LOW
HCT: 30.3 — ABNORMAL LOW
HCT: 30.6 — ABNORMAL LOW
HCT: 31.5 — ABNORMAL LOW
HCT: 31.7 — ABNORMAL LOW
HCT: 34.7 — ABNORMAL LOW
Hemoglobin: 10.7 — ABNORMAL LOW
Hemoglobin: 10.7 — ABNORMAL LOW
Hemoglobin: 11.6 — ABNORMAL LOW
Hemoglobin: 12.1
MCHC: 33.8
MCHC: 33.9
MCV: 90.1
MCV: 90.5
MCV: 91
Platelets: 217
Platelets: 244
Platelets: 247
Platelets: 258
Platelets: 264
Platelets: 274
RBC: 3.48 — ABNORMAL LOW
RBC: 3.95
RDW: 13.6
RDW: 13.7
RDW: 13.9
RDW: 14.1 — ABNORMAL HIGH
RDW: 14.1 — ABNORMAL HIGH
WBC: 4.1
WBC: 4.1
WBC: 4.3
WBC: 4.4
WBC: 4.4
WBC: 4.6
WBC: 4.9
WBC: 7

## 2010-10-25 LAB — PROTIME-INR
INR: 2.2 — ABNORMAL HIGH
INR: 1.1
INR: 1.2
INR: 1.4
INR: 1.5
INR: 1.6 — ABNORMAL HIGH
INR: 2 — ABNORMAL HIGH
Prothrombin Time: 15
Prothrombin Time: 17.2 — ABNORMAL HIGH

## 2010-10-25 LAB — POCT CARDIAC MARKERS
CKMB, poc: 5
CKMB, poc: 7.9
Myoglobin, poc: 78.5
Troponin i, poc: 0.54

## 2010-10-25 LAB — HEPARIN LEVEL (UNFRACTIONATED)
Heparin Unfractionated: 0.36
Heparin Unfractionated: 0.4
Heparin Unfractionated: 0.57
Heparin Unfractionated: 0.66
Heparin Unfractionated: 0.67
Heparin Unfractionated: 0.77 — ABNORMAL HIGH
Heparin Unfractionated: 0.77 — ABNORMAL HIGH
Heparin Unfractionated: 1.46 — ABNORMAL HIGH
Heparin Unfractionated: <0.1 — ABNORMAL LOW

## 2010-10-25 LAB — CARDIOLIPIN ANTIBODIES, IGG, IGM, IGA: Anticardiolipin IgA: 7 — ABNORMAL LOW (ref <13)

## 2010-10-25 LAB — BASIC METABOLIC PANEL
BUN: 12
CO2: 24
Calcium: 9.3
Chloride: 104
GFR calc Af Amer: >60
Glucose, Bld: 92
Potassium: 4.4
Potassium: 4.6
Sodium: 134 — ABNORMAL LOW
Sodium: 140

## 2010-10-25 LAB — APTT: aPTT: 110 — ABNORMAL HIGH

## 2010-10-25 LAB — PROTHROMBIN GENE MUTATION

## 2010-10-25 LAB — DIFFERENTIAL
Lymphocytes Relative: 41
Monocytes Absolute: 0.3
Monocytes Relative: 5
Neutro Abs: 3.1

## 2010-10-25 LAB — ANTITHROMBIN III: AntiThromb III Func: 54 — ABNORMAL LOW

## 2010-10-25 LAB — LUPUS ANTICOAGULANT PANEL
Lupus Anticoagulant: NOT DETECTED
PTT Lupus Anticoagulant: 198.7 — ABNORMAL HIGH (ref 36.3–48.8)
dRVVT Incubated 1:1 Mix: 41.7 (ref 36.1–47.0)

## 2010-10-25 NOTE — Telephone Encounter (Signed)
Current dose 12.5 daily------------->  87.5 mg weekly New dose : 12.5 mg  daily except Monday Wednesday and Friday take 15mg  ------------>  95 mg weekly Needs a OV  in 2 weeks

## 2010-10-25 NOTE — Telephone Encounter (Signed)
LMOM to inform patient with call back name & number.

## 2010-10-25 NOTE — Telephone Encounter (Signed)
Patient takes 12.5 mg of Coumadin every day since 10/05/10.

## 2010-10-28 ENCOUNTER — Other Ambulatory Visit: Payer: Self-pay | Admitting: *Deleted

## 2010-10-28 ENCOUNTER — Other Ambulatory Visit: Payer: Self-pay | Admitting: Internal Medicine

## 2010-10-28 MED ORDER — WARFARIN SODIUM 2.5 MG PO TABS: ORAL_TABLET | ORAL | Status: DC

## 2010-10-28 MED ORDER — WARFARIN SODIUM 5 MG PO TABS: ORAL_TABLET | ORAL | Status: DC

## 2010-10-28 NOTE — Telephone Encounter (Signed)
Rx sent to pharmacy   

## 2010-10-28 NOTE — Telephone Encounter (Signed)
Rx[s] for 5 mg & 2.5 mg tablets to pharmacy.

## 2010-10-28 NOTE — Telephone Encounter (Signed)
Dose for coumadin was increased - patient needs new rx [facility] [name]

## 2010-11-11 ENCOUNTER — Telehealth: Payer: Self-pay

## 2010-11-11 ENCOUNTER — Ambulatory Visit (INDEPENDENT_AMBULATORY_CARE_PROVIDER_SITE_OTHER): Payer: Self-pay | Admitting: *Deleted

## 2010-11-11 VITALS — BP 122/82 | HR 75 | Temp 98.9°F | Wt 268.0 lb

## 2010-11-11 DIAGNOSIS — Z79899 Other long term (current) drug therapy: Secondary | ICD-10-CM

## 2010-11-11 NOTE — Telephone Encounter (Signed)
Patient made aware of the change to the Coumadin. Instructed to take 12.5mg  except for mon, wed, fri when she will be taking 15mg  dose

## 2010-11-11 NOTE — Patient Instructions (Addendum)
Advise patient: Skip Coumadin for 2 days. Needs to buy a new Coumadin tablet: 6 mg Take 2 tablets every day except Tuesdays and Fridays when she takes 2.5 tablets. (90 mg weekly) Recheck INR in 16 days. --------------------------- See phone note  [name]

## 2010-11-12 MED ORDER — WARFARIN SODIUM 2 MG PO TABS: 2.0000 mg | ORAL_TABLET | Freq: Every day | ORAL | Status: DC

## 2010-11-12 NOTE — Telephone Encounter (Signed)
Addended by: Beverely Low on: 11/12/2010 02:58 PM   Modules accepted: Orders

## 2010-11-12 NOTE — Telephone Encounter (Signed)
Pt aware and verbalized understanding. Rx for coumadin 2 mg sent to pharmacy

## 2010-11-12 NOTE — Telephone Encounter (Signed)
Please call patient again, give her the following instructions:   Needs to buy a new Coumadin tablet: 6 mg  Take 2 tablets every day except Tuesdays and Fridays when she takes 2.5 tablets. (90 mg weekly)  Recheck INR in 16 days.

## 2010-11-12 NOTE — Telephone Encounter (Signed)
Coumadin 6mg  no available thus needs to use her available 5 mg tabs, call in 2  mg tab "as directed" #60, no Rf: Take two 5 mg tabs and one 2mg  tab a day (12 mg) daily Tuesday and Friday take three 5mg  tablets (15mg  ) The regimen is complicated, if needed , bring her for a nurse visit to be sure she understood [facility]

## 2010-11-28 ENCOUNTER — Telehealth: Payer: Self-pay | Admitting: Internal Medicine

## 2010-11-28 NOTE — Telephone Encounter (Signed)
Patient is due for an INR, please make her an appointment

## 2010-11-29 NOTE — Telephone Encounter (Signed)
Please send a letter reminding her  she is due for an INR check

## 2010-11-29 NOTE — Telephone Encounter (Signed)
Home # is disconnected. Cell # VM hasn't been set up.

## 2010-12-02 NOTE — Telephone Encounter (Signed)
Done

## 2010-12-04 ENCOUNTER — Ambulatory Visit (INDEPENDENT_AMBULATORY_CARE_PROVIDER_SITE_OTHER): Payer: Self-pay | Admitting: *Deleted

## 2010-12-04 ENCOUNTER — Encounter: Payer: Self-pay | Admitting: *Deleted

## 2010-12-04 DIAGNOSIS — I2699 Other pulmonary embolism without acute cor pulmonale: Secondary | ICD-10-CM

## 2010-12-04 DIAGNOSIS — Z7901 Long term (current) use of anticoagulants: Secondary | ICD-10-CM

## 2010-12-04 DIAGNOSIS — Z79899 Other long term (current) drug therapy: Secondary | ICD-10-CM

## 2010-12-04 MED ORDER — WARFARIN SODIUM 2.5 MG PO TABS: ORAL_TABLET | ORAL | Status: DC

## 2010-12-04 MED ORDER — WARFARIN SODIUM 5 MG PO TABS: ORAL_TABLET | ORAL | Status: DC

## 2010-12-04 MED ORDER — WARFARIN SODIUM 2 MG PO TABS: 2.0000 mg | ORAL_TABLET | Freq: Every day | ORAL | Status: DC

## 2010-12-04 NOTE — Patient Instructions (Addendum)
Continue present dosage of Take 12.5 mg daily except 15 mg M,W,F  No changes to be made Re-check PT/INR again in 3 weeks Ordered Coumadin for 2mg -2.5mg -5mg  sent via e script to pharmacy

## 2010-12-06 ENCOUNTER — Ambulatory Visit: Payer: Self-pay

## 2011-02-08 ENCOUNTER — Telehealth: Payer: Self-pay | Admitting: Internal Medicine

## 2011-02-08 NOTE — Telephone Encounter (Signed)
Over due for INR, call pt , set an appointment within a week. If she is not coming , let me know

## 2011-02-10 NOTE — Telephone Encounter (Signed)
Spoke with patient. She says she has a cold and will call when she is better to schedule appt.

## 2011-02-10 NOTE — Telephone Encounter (Signed)
Noted  

## 2012-04-19 ENCOUNTER — Other Ambulatory Visit: Payer: Self-pay | Admitting: Obstetrics & Gynecology

## 2012-10-11 ENCOUNTER — Institutional Professional Consult (permissible substitution): Payer: Self-pay | Admitting: Internal Medicine

## 2013-02-24 ENCOUNTER — Ambulatory Visit: Payer: Self-pay | Admitting: Dietician

## 2013-04-19 ENCOUNTER — Other Ambulatory Visit: Payer: Self-pay | Admitting: Obstetrics & Gynecology

## 2013-05-16 ENCOUNTER — Other Ambulatory Visit: Payer: Self-pay | Admitting: Obstetrics & Gynecology

## 2013-07-27 ENCOUNTER — Emergency Department (HOSPITAL_COMMUNITY)
Admission: EM | Admit: 2013-07-27 | Discharge: 2013-07-27 | Discharge status: Against Medical Advice | Payer: Managed Care, Other (non HMO) | Attending: Emergency Medicine | Admitting: Emergency Medicine

## 2013-07-27 ENCOUNTER — Encounter (HOSPITAL_COMMUNITY): Payer: Self-pay | Admitting: Emergency Medicine

## 2013-07-27 DIAGNOSIS — D259 Leiomyoma of uterus, unspecified: Secondary | ICD-10-CM | POA: Insufficient documentation

## 2013-07-27 DIAGNOSIS — R110 Nausea: Secondary | ICD-10-CM | POA: Insufficient documentation

## 2013-07-27 DIAGNOSIS — K429 Umbilical hernia without obstruction or gangrene: Secondary | ICD-10-CM | POA: Insufficient documentation

## 2013-07-27 HISTORY — DX: Benign neoplasm of connective and other soft tissue, unspecified: D21.9

## 2013-07-27 HISTORY — DX: Acute embolism and thrombosis of unspecified deep veins of unspecified lower extremity: I82.409

## 2013-07-27 HISTORY — DX: Umbilical hernia without obstruction or gangrene: K42.9

## 2013-07-27 HISTORY — DX: Unspecified ectopic pregnancy without intrauterine pregnancy: O00.90

## 2013-07-27 HISTORY — DX: Other pulmonary embolism without acute cor pulmonale: I26.99

## 2013-07-27 NOTE — ED Notes (Signed)
Pt states has an umbilical hernia and fibroids, states having abdominal pain that shoots across abdomen then down in pelvic area, pt states unsure if pain is from hernia or fibroids, pt states having nausea but denies vomiting or diarrhea.

## 2013-07-27 NOTE — ED Notes (Signed)
Pt told registration clerk she was leaving, handed in her sticker sheet, and walked out

## 2013-07-28 ENCOUNTER — Ambulatory Visit (INDEPENDENT_AMBULATORY_CARE_PROVIDER_SITE_OTHER): Payer: Managed Care, Other (non HMO) | Admitting: General Surgery

## 2013-08-08 ENCOUNTER — Encounter (INDEPENDENT_AMBULATORY_CARE_PROVIDER_SITE_OTHER): Payer: Self-pay | Admitting: General Surgery

## 2013-08-08 ENCOUNTER — Ambulatory Visit (INDEPENDENT_AMBULATORY_CARE_PROVIDER_SITE_OTHER): Payer: Managed Care, Other (non HMO) | Admitting: General Surgery

## 2013-08-08 ENCOUNTER — Telehealth (INDEPENDENT_AMBULATORY_CARE_PROVIDER_SITE_OTHER): Payer: Self-pay

## 2013-08-08 ENCOUNTER — Encounter (INDEPENDENT_AMBULATORY_CARE_PROVIDER_SITE_OTHER): Payer: Self-pay

## 2013-08-08 VITALS — BP 126/72 | HR 79 | Temp 98.0°F | Ht 66.0 in | Wt 243.0 lb

## 2013-08-08 DIAGNOSIS — K432 Incisional hernia without obstruction or gangrene: Secondary | ICD-10-CM

## 2013-08-08 NOTE — Progress Notes (Signed)
Patient ID: [name], female   DOB: Nov 19, 1960, 53 y.o.   MRN: [id number]  Chief Complaint  Patient presents with  . Incisional Hernia    HPI [name] is a 53 y.o. female.  Referred by Dr. Vania Rea for evaluation and surgical repair of a hernia at the umbilicus, at the site of her previous laparoscopic surgery. The patient is on Coumadin chronically because she has had 2 separate episodes of pulmonary embolism. This is managed by her primary care physician, Dr. Andrea Gibraltar in [place].   The patient has had 2 ectopic pregnancies. She's had a laparoscopic procedure from the infraumbilical incision. She has a Pfannenstiel incision. She wonders if she's had a hernia for some time. She says it popped out a month ago it is painful at her umbilicus. She's recent evaluated by Dr. Stann Mainland and may need a uterine ablation or a hysterectomy at some point. I have discussed this with Dr. Stann Mainland and he wants to hold off on any uterine surgery for now and simply let me her repair the hernia.  Patient has no prior history of hernia. She's tolerating her diet. Normal bowel function.  Comorbidities include 2 separate episodes of pulmonary was, 2008, in 2012 on chronic Coumadin. 2 ectopic pregnancies. Obesity. Uterine fibroids.  She works full time. Lives alone. Mother and sister nearby. HPI  Past Medical History  Diagnosis Date  . Umbilical hernia   . Fibroids   . PE (pulmonary embolism)   . DVT (deep venous thrombosis)   . Ectopic pregnancy   . Clotting disorder   . Hyperlipidemia     Past Surgical History  Procedure Laterality Date  . Knee surgery      right    Family History  Problem Relation Age of Onset  . Cancer Father     Social History History  Substance Use Topics  . Smoking status: Former Smoker    Types: Cigarettes    Quit date: 08/09/2003  . Smokeless tobacco: Never Used  . Alcohol Use: Yes    Allergies  Allergen Reactions  . Pregabalin     Current  Outpatient Prescriptions  Medication Sig Dispense Refill  . warfarin (COUMADIN) 10 MG tablet Take 10 mg by mouth daily.      Marland Kitchen warfarin (COUMADIN) 2.5 MG tablet Take 2.5 mg by mouth daily.       No current facility-administered medications for this visit.    Review of Systems Review of Systems  Constitutional: Negative for fever, chills and unexpected weight change.  HENT: Negative for congestion, hearing loss, sore throat, trouble swallowing and voice change.   Eyes: Negative for visual disturbance.  Respiratory: Negative for cough and wheezing.   Cardiovascular: Negative for chest pain, palpitations and leg swelling.  Gastrointestinal: Positive for abdominal pain. Negative for nausea, vomiting, diarrhea, constipation, blood in stool, abdominal distention and anal bleeding.  Genitourinary: Positive for vaginal bleeding and pelvic pain. Negative for hematuria and difficulty urinating.  Musculoskeletal: Negative for arthralgias.  Skin: Negative for rash and wound.  Neurological: Negative for seizures, syncope and headaches.  Hematological: Negative for adenopathy. Does not bruise/bleed easily.  Psychiatric/Behavioral: Negative for confusion.    Blood pressure 126/72, pulse 79, temperature 98 F (36.7 C), height 5\' 6"  (1.676 m), weight 243 lb (110.224 kg), last menstrual period 07/05/2013.  Physical Exam Physical Exam  Constitutional: She is oriented to person, place, and time. She appears well-developed and well-nourished. No distress.  Weight 243. Height 5 feet 6 inches.  BMI 39.24.  HENT:  Head: Normocephalic and atraumatic.  Nose: Nose normal.  Mouth/Throat: No oropharyngeal exudate.  Eyes: Conjunctivae and EOM are normal. Pupils are equal, round, and reactive to light. Left eye exhibits no discharge. No scleral icterus.  Neck: Neck supple. No JVD present. No tracheal deviation present. No thyromegaly present.  Cardiovascular: Normal rate, regular rhythm, normal heart sounds and  intact distal pulses.   No murmur heard. Pulmonary/Chest: Effort normal and breath sounds normal. No respiratory distress. She has no wheezes. She has no rales. She exhibits no tenderness.  Abdominal: Soft. Bowel sounds are normal. She exhibits no distension and no mass. There is tenderness. There is no rebound and no guarding.    Well-healed transverse incision below the umbilicus. This was tender incarcerated umbilical hernia, not that large. Cannot reduce. Well-healed Pfannenstiel scar.  Musculoskeletal: She exhibits no edema and no tenderness.  Lymphadenopathy:    She has no cervical adenopathy.  Neurological: She is alert and oriented to person, place, and time. She exhibits normal muscle tone. Coordination normal.  Skin: Skin is warm. No rash noted. She is not diaphoretic. No erythema. No pallor.  Psychiatric: She has a normal mood and affect. Her behavior is normal. Judgment and thought content normal.    Data Reviewed Office notes from Dr. Stann Mainland  Assessment    Small, painful, incarcerated incisional hernia, periumbilical elective repair is offered to help prevent enlargement and to manage pain long term.  History of recurrent pulmonary embolism, on chronic Coumadin. Likely will need perioperative Lovenox bridge  Uterine fibroids. Currently under evaluation by Dr. Patrick Jupiter  History ectopic pregnancy x2  Obesity     Plan    I offered to repair her periumbilical incisional hernia with mesh, and she would like to go ahead and do that. She knows that she will have surgical pain for Cheryl weeks before he gets better and she is okay with that. She knows that she will have to stent work for 2 weeks and no sports or heavy lifting for 6 weeks. She is okay with that.  She will be scheduled for open repair of her small periumbilical incisional hernia with mesh in the near future. She requests [facility].Marland Kitchen  We will ask her primary doctor, Dr. Andrea Gibraltar, in [place], to manage the  Lovenox bridge. She will need to discontinue her Coumadin for 5 days preop to minimize the risk of bleeding. She will be to discontinue her Lovenox 24 hours preop.  I discussed the indications, details, techniques, and numerous risk of the surgery with her. She's aware of  the risk of bleeding, infection, recurrence, and injury to the intestine with major reconstructive surgery, cardiac, pulmonary, and thromboembolic problems. She understands these issues well. All questions answered. . She agrees with this plan.  We will schedule surgery once we get medical clearance and a plan for managing perioperative anticoagulation from Dr. Gibraltar.        Edsel Petrin. Dalbert Batman, M.D., [facility] General and Minimally invasive Surgery Breast and Colorectal Surgery Office:   [phone] Pager:   [phone]  08/08/2013, 2:12 PM

## 2013-08-08 NOTE — Telephone Encounter (Signed)
Called and spoke to patient appointment time moved up for today with Dr. Dalbert Batman.  Patient to arrive at 1:15pm for a 1:30 pm appointment.

## 2013-08-08 NOTE — Patient Instructions (Signed)
You have a small incisional hernia about the umbilicus. There may be some trapped fat there. I suspect this is the cause of your pain.  I have discussed your comprehensive treatment plan with Dr. Stann Mainland. He wants to hold off on the hysterectomy for now.  You will be scheduled for open repair of her periumbilical incisional hernia with mesh in the near future at [facility]  We will obtain medical clearance from Dr. Gibraltar, your primary care physician. We will request that Dr. Gibraltar manage your Lovenox bridge preop and postop.  You will need to stop Coumadin for 5 days before the surgery  You should stop the Lovenox 24 hours prior to the surgery.   Ventral Hernia A ventral hernia (also called an incisional hernia) is a hernia that occurs at the site of a previous surgical cut (incision) in the abdomen. The abdominal wall spans from your lower chest down to your pelvis. If the abdominal wall is weakened from a surgical incision, a hernia can occur. A hernia is a bulge of bowel or muscle tissue pushing out on the weakened part of the abdominal wall. Ventral hernias can get bigger from straining or lifting. Obese and older people are at higher risk for a ventral hernia. People who develop infections after surgery or require repeat incisions at the same site on the abdomen are also at increased risk. CAUSES  A ventral hernia occurs because of weakness in the abdominal wall at an incision site.  SYMPTOMS  Common symptoms include:  A visible bulge or lump on the abdominal wall.  Pain or tenderness around the lump.  Increased discomfort if you cough or make a sudden movement. If the hernia has blocked part of the intestine, a serious complication can occur (incarcerated or strangulated hernia). This can become a problem that requires emergency surgery because the blood flow to the blocked intestine may be cut off. Symptoms may include:  Feeling sick to your stomach  (nauseous).  Throwing up (vomiting).  Stomach swelling (distention) or bloating.  Fever.  Rapid heartbeat. DIAGNOSIS  Your health care provider will take a medical history and perform a physical exam. Various tests may be ordered, such as:  Blood tests.  Urine tests.  Ultrasonography.  X-rays.  Computed tomography (CT). TREATMENT  Watchful waiting may be all that is needed for a smaller hernia that does not cause symptoms. Your health care provider may recommend the use of a supportive belt (truss) that helps to keep the abdominal wall intact. For larger hernias or those that cause pain, surgery to repair the hernia is usually recommended. If a hernia becomes strangulated, emergency surgery needs to be done right away. HOME CARE INSTRUCTIONS  Avoid putting pressure or strain on the abdominal area.  Avoid heavy lifting.  Use good body positioning for physical tasks. Ask your health care provider about proper body positioning.  Use a supportive belt as directed by your health care provider.  Maintain a healthy weight.  Eat foods that are high in fiber, such as whole grains, fruits, and vegetables. Fiber helps prevent difficult bowel movements (constipation).  Drink enough fluids to keep your urine clear or pale yellow.  Follow up with your health care provider as directed. SEEK MEDICAL CARE IF:   Your hernia seems to be getting larger or more painful. SEEK IMMEDIATE MEDICAL CARE IF:   You have abdominal pain that is sudden and sharp.  Your pain becomes severe.  You have repeated vomiting.  You are  sweating a lot.  You notice a rapid heartbeat.  You develop a fever. MAKE SURE YOU:   Understand these instructions.  Will watch your condition.  Will get help right away if you are not doing well or get worse. Document Released: 12/17/2011 Document Revised: 05/16/2013 Document Reviewed: 12/17/2011 [facility] Patient Information 2015 [place], [place]. This  information is not intended to replace advice given to you by your health care provider. Make sure you discuss any questions you have with your health care provider.

## 2013-08-09 ENCOUNTER — Telehealth (INDEPENDENT_AMBULATORY_CARE_PROVIDER_SITE_OTHER): Payer: Self-pay

## 2013-08-09 NOTE — Telephone Encounter (Signed)
Medical clearance/Lovenox management letter with office notes from 08/08/13 visit faxed to Dr. Roney Jaffe @ [phone] fax confirmation rec'd and attached to outgoing fax.

## 2013-08-17 ENCOUNTER — Telehealth (INDEPENDENT_AMBULATORY_CARE_PROVIDER_SITE_OTHER): Payer: Self-pay

## 2013-08-17 NOTE — Telephone Encounter (Signed)
Pt called to see if we received the letter clearing her for surgery. After looking through pts chart, I do not see that we received clearance. She states she has a copy with her. She got our fax number and will try and fax a copy to attn [place]. If not, she will bring a copy to [place].

## 2013-08-18 ENCOUNTER — Other Ambulatory Visit (INDEPENDENT_AMBULATORY_CARE_PROVIDER_SITE_OTHER): Payer: Self-pay | Admitting: General Surgery

## 2013-08-18 NOTE — Telephone Encounter (Signed)
Pt called back asking if we received clearance letter. After speaking to [place], we received clearance. Orders were done and are with our scheduling dept. Pt aware they will call once pre certification is complete to give her a surgery date

## 2013-08-19 ENCOUNTER — Encounter (HOSPITAL_BASED_OUTPATIENT_CLINIC_OR_DEPARTMENT_OTHER): Payer: Self-pay | Admitting: *Deleted

## 2013-08-19 NOTE — Progress Notes (Signed)
Will come in 08/23/13 for labs and u/a

## 2013-08-19 NOTE — Progress Notes (Signed)
08/19/13 1425  OBSTRUCTIVE SLEEP APNEA  Have you ever been diagnosed with sleep apnea through a sleep study? No  Do you snore loudly (loud enough to be heard through closed doors)?  0  Do you often feel tired, fatigued, or sleepy during the daytime? 1  Has anyone observed you stop breathing during your sleep? 0  Do you have, or are you being treated for high blood pressure? 0  BMI more than 35 kg/m2? 1  Age over 51 years old? 1  Neck circumference greater than 40 cm/16 inches? 1  Gender: 0  Obstructive Sleep Apnea Score 4  Score 4 or greater  Results sent to PCP

## 2013-08-22 NOTE — H&P (Signed)
[name]   MRN:  [id number]   Description: 53 year old female  Provider: Adin Hector, MD  Department: [facility]         Diagnoses      Incisional hernia, without obstruction or gangrene    -  Primary      553.21               Current Vitals Most recent update: 08/08/2013  1:47 PM by Marjean Donna, CMA      BP Pulse Temp(Src) Ht Wt BMI      126/72 79 98 F (36.7 C) 5\' 6"  (1.676 m) 243 lb (110.224 kg) 39.24 kg/m2               History and Physical   Adin Hector, MD     Status: Signed            Patient ID: [name], female   DOB: 06-17-1960, 53 y.o.   MRN: [id number]               HPI [name] is a 53 y.o. female.  Referred by Dr. Vania Rea for evaluation and surgical repair of a hernia at the umbilicus, at the site of her previous laparoscopic surgery. The patient is on Coumadin chronically because she has had 2 separate episodes of pulmonary embolism. This is managed by her primary care physician, Dr. Andrea Gibraltar in [place].    The patient has had 2 ectopic pregnancies. She's had a laparoscopic procedure from the infraumbilical incision. She has a Pfannenstiel incision. She wonders if she's had a hernia for some time. She says it popped out a month ago it is painful at her umbilicus. She's recent evaluated by Dr. Stann Mainland and may need a uterine ablation or a hysterectomy at some point. I have discussed this with Dr. Stann Mainland and he wants to hold off on any uterine surgery for now and simply let me her repair the hernia.   Patient has no prior history of hernia. She's tolerating her diet. Normal bowel function.   Comorbidities include 2 separate episodes of pulmonary was, 2008, in 2012 on chronic Coumadin. 2 ectopic pregnancies. Obesity. Uterine fibroids.   She works full time. Lives alone. Mother and sister nearby.         Past Medical History   Diagnosis  Date   .  Umbilical hernia     .  Fibroids     .  PE  (pulmonary embolism)     .  DVT (deep venous thrombosis)     .  Ectopic pregnancy     .  Clotting disorder     .  Hyperlipidemia           Past Surgical History   Procedure  Laterality  Date   .  Knee surgery           right         Family History   Problem  Relation  Age of Onset   .  Cancer  Father          Social History History   Substance Use Topics   .  Smoking status:  Former Smoker       Types:  Cigarettes       Quit date:  08/09/2003   .  Smokeless tobacco:  Never Used   .  Alcohol Use:  Yes         Allergies  Allergen  Reactions   .  Pregabalin           Current Outpatient Prescriptions   Medication  Sig  Dispense  Refill   .  warfarin (COUMADIN) 10 MG tablet  Take 10 mg by mouth daily.         Marland Kitchen  warfarin (COUMADIN) 2.5 MG tablet  Take 2.5 mg by mouth daily.              Review of Systems  Constitutional: Negative for fever, chills and unexpected weight change.  HENT: Negative for congestion, hearing loss, sore throat, trouble swallowing and voice change.   Eyes: Negative for visual disturbance.  Respiratory: Negative for cough and wheezing.   Cardiovascular: Negative for chest pain, palpitations and leg swelling.  Gastrointestinal: Positive for abdominal pain. Negative for nausea, vomiting, diarrhea, constipation, blood in stool, abdominal distention and anal bleeding.  Genitourinary: Positive for vaginal bleeding and pelvic pain. Negative for hematuria and difficulty urinating.  Musculoskeletal: Negative for arthralgias.  Skin: Negative for rash and wound.  Neurological: Negative for seizures, syncope and headaches.  Hematological: Negative for adenopathy. Does not bruise/bleed easily.  Psychiatric/Behavioral: Negative for confusion.      Blood pressure 126/72, pulse 79, temperature 98 F (36.7 C), height 5\' 6"  (1.676 m), weight 243 lb (110.224 kg), last menstrual period 07/05/2013.   Physical Exam  Constitutional: She is  oriented to person, place, and time. She appears well-developed and well-nourished. No distress.  Weight 243. Height 5 feet 6 inches. BMI 39.24.  HENT:   Head: Normocephalic and atraumatic.   Nose: Nose normal.   Mouth/Throat: No oropharyngeal exudate.  Eyes: Conjunctivae and EOM are normal. Pupils are equal, round, and reactive to light. Left eye exhibits no discharge. No scleral icterus.  Neck: Neck supple. No JVD present. No tracheal deviation present. No thyromegaly present.  Cardiovascular: Normal rate, regular rhythm, normal heart sounds and intact distal pulses.    No murmur heard. Pulmonary/Chest: Effort normal and breath sounds normal. No respiratory distress. She has no wheezes. She has no rales. She exhibits no tenderness.  Abdominal: Soft. Bowel sounds are normal. She exhibits no distension and no mass. There is mild tenderness. There is no rebound and no guarding.  Well-healed transverse incision below the umbilicus. This was tender incarcerated umbilical hernia, not that large. Cannot reduce. Well-healed Pfannenstiel scar.  Musculoskeletal: She exhibits no edema and no tenderness.  Lymphadenopathy:    She has no cervical adenopathy.  Neurological: She is alert and oriented to person, place, and time. She exhibits normal muscle tone. Coordination normal.  Skin: Skin is warm. No rash noted. She is not diaphoretic. No erythema. No pallor.  Psychiatric: She has a normal mood and affect. Her behavior is normal. Judgment and thought content normal.      Data Reviewed Office notes from Dr. Stann Mainland   Assessment    Small, painful, incarcerated incisional hernia, periumbilical elective repair is offered to help prevent enlargement and to manage pain long term.   History of recurrent pulmonary embolism, on chronic Coumadin. Likely will need perioperative Lovenox bridge   Uterine fibroids. Currently under evaluation by Dr. Patrick Jupiter   History ectopic pregnancy x2   Obesity       Plan    I offered to repair her periumbilical incisional hernia with mesh, and she would like to go ahead and do that. She knows that she will have surgical pain for Cheryl weeks before he gets better and she is okay  with that. She knows that she will have to stent work for 2 weeks and no sports or heavy lifting for 6 weeks. She is okay with that.   She will be scheduled for open repair of her small periumbilical incisional hernia with mesh in the near future. She requests [facility]Marland Kitchen   We will ask her primary doctor, Dr. Andrea Gibraltar, in [place], to manage the Lovenox bridge. She will need to discontinue her Coumadin for 5 days preop to minimize the risk of bleeding. She will be to discontinue her Lovenox 24 hours preop.   I discussed the indications, details, techniques, and numerous risk of the surgery with her. She's aware of  the risk of bleeding, infection, recurrence, and injury to the intestine with major reconstructive surgery, cardiac, pulmonary, and thromboembolic problems. She understands these issues well. All questions answered. . She agrees with this plan.   We will schedule surgery once we get medical clearance and a plan for managing perioperative anticoagulation from Dr. Gibraltar.          Edsel Petrin. Dalbert Batman, M.D., [facility] General and Minimally invasive Surgery Breast and Colorectal Surgery Office:   [phone] Pager:   [phone]

## 2013-08-23 ENCOUNTER — Encounter (HOSPITAL_BASED_OUTPATIENT_CLINIC_OR_DEPARTMENT_OTHER)
Admission: RE | Admit: 2013-08-23 | Discharge: 2013-08-23 | Disposition: A | Discharge status: Home / Self Care | Payer: Managed Care, Other (non HMO) | Source: Ambulatory Visit | Attending: General Surgery | Admitting: General Surgery

## 2013-08-23 DIAGNOSIS — D259 Leiomyoma of uterus, unspecified: Secondary | ICD-10-CM | POA: Diagnosis not present

## 2013-08-23 DIAGNOSIS — E669 Obesity, unspecified: Secondary | ICD-10-CM | POA: Diagnosis not present

## 2013-08-23 DIAGNOSIS — Z7901 Long term (current) use of anticoagulants: Secondary | ICD-10-CM | POA: Diagnosis not present

## 2013-08-23 DIAGNOSIS — Z86711 Personal history of pulmonary embolism: Secondary | ICD-10-CM | POA: Diagnosis not present

## 2013-08-23 DIAGNOSIS — Z87891 Personal history of nicotine dependence: Secondary | ICD-10-CM | POA: Diagnosis not present

## 2013-08-23 DIAGNOSIS — K432 Incisional hernia without obstruction or gangrene: Secondary | ICD-10-CM | POA: Diagnosis not present

## 2013-08-23 DIAGNOSIS — E785 Hyperlipidemia, unspecified: Secondary | ICD-10-CM | POA: Diagnosis not present

## 2013-08-23 DIAGNOSIS — Z888 Allergy status to other drugs, medicaments and biological substances status: Secondary | ICD-10-CM | POA: Diagnosis not present

## 2013-08-23 LAB — CBC WITH DIFFERENTIAL/PLATELET
Basophils Absolute: 0 10*3/uL (ref 0.0–0.1)
Basophils Relative: 1 % (ref 0–1)
Eosinophils Absolute: 0.1 10*3/uL (ref 0.0–0.7)
Eosinophils Relative: 3 % (ref 0–5)
HEMATOCRIT: 35.2 % — AB (ref 36.0–46.0)
Hemoglobin: 11.5 g/dL — ABNORMAL LOW (ref 12.0–15.0)
LYMPHS PCT: 45 % (ref 12–46)
Lymphs Abs: 1.9 10*3/uL (ref 0.7–4.0)
MCH: 29.7 pg (ref 26.0–34.0)
MCHC: 32.7 g/dL (ref 30.0–36.0)
MCV: 91 fL (ref 78.0–100.0)
Monocytes Absolute: 0.3 10*3/uL (ref 0.1–1.0)
Monocytes Relative: 7 % (ref 3–12)
NEUTROS ABS: 1.8 10*3/uL (ref 1.7–7.7)
Neutrophils Relative %: 44 % (ref 43–77)
PLATELETS: 279 10*3/uL (ref 150–400)
RBC: 3.87 MIL/uL (ref 3.87–5.11)
RDW: 15.5 % (ref 11.5–15.5)
WBC: 4.2 10*3/uL (ref 4.0–10.5)

## 2013-08-23 LAB — URINALYSIS, ROUTINE W REFLEX MICROSCOPIC
Bilirubin Urine: NEGATIVE
Glucose, UA: NEGATIVE mg/dL
HGB URINE DIPSTICK: NEGATIVE
Ketones, ur: NEGATIVE mg/dL
Leukocytes, UA: NEGATIVE
NITRITE: NEGATIVE
PH: 7 (ref 5.0–8.0)
Protein, ur: NEGATIVE mg/dL
SPECIFIC GRAVITY, URINE: 1.021 (ref 1.005–1.030)
Urobilinogen, UA: 1 mg/dL (ref 0.0–1.0)

## 2013-08-23 LAB — COMPREHENSIVE METABOLIC PANEL
ALK PHOS: 53 U/L (ref 39–117)
ALT: 12 U/L (ref 0–35)
AST: 18 U/L (ref 0–37)
Albumin: 3.5 g/dL (ref 3.5–5.2)
Anion gap: 10 (ref 5–15)
BILIRUBIN TOTAL: 0.4 mg/dL (ref 0.3–1.2)
BUN: 11 mg/dL (ref 6–23)
CHLORIDE: 103 meq/L (ref 96–112)
CO2: 26 meq/L (ref 19–32)
Calcium: 8.9 mg/dL (ref 8.4–10.5)
Creatinine, Ser: 0.93 mg/dL (ref 0.50–1.10)
GFR calc Af Amer: 81 mL/min — ABNORMAL LOW (ref >90)
GFR, EST NON AFRICAN AMERICAN: 70 mL/min — AB (ref >90)
Glucose, Bld: 84 mg/dL (ref 70–99)
Potassium: 4.5 mEq/L (ref 3.7–5.3)
SODIUM: 139 meq/L (ref 137–147)
Total Protein: 7.6 g/dL (ref 6.0–8.3)

## 2013-08-23 LAB — PROTIME-INR
INR: 1.36 (ref 0.00–1.49)
Prothrombin Time: 16.8 seconds — ABNORMAL HIGH (ref 11.6–15.2)

## 2013-08-23 LAB — APTT: APTT: 41 s — AB (ref 24–37)

## 2013-08-24 ENCOUNTER — Encounter (HOSPITAL_BASED_OUTPATIENT_CLINIC_OR_DEPARTMENT_OTHER): Payer: Self-pay | Admitting: Certified Registered"

## 2013-08-24 ENCOUNTER — Ambulatory Visit (HOSPITAL_BASED_OUTPATIENT_CLINIC_OR_DEPARTMENT_OTHER)
Admission: RE | Admit: 2013-08-24 | Discharge: 2013-08-24 | Disposition: A | Discharge status: Home / Self Care | Payer: Managed Care, Other (non HMO) | Source: Ambulatory Visit | Attending: General Surgery | Admitting: General Surgery

## 2013-08-24 ENCOUNTER — Encounter (HOSPITAL_BASED_OUTPATIENT_CLINIC_OR_DEPARTMENT_OTHER)
Admission: RE | Disposition: A | Discharge status: Home / Self Care | Payer: Self-pay | Source: Ambulatory Visit | Attending: General Surgery

## 2013-08-24 ENCOUNTER — Encounter (HOSPITAL_BASED_OUTPATIENT_CLINIC_OR_DEPARTMENT_OTHER): Payer: Managed Care, Other (non HMO) | Admitting: Certified Registered"

## 2013-08-24 ENCOUNTER — Ambulatory Visit (HOSPITAL_BASED_OUTPATIENT_CLINIC_OR_DEPARTMENT_OTHER): Payer: Managed Care, Other (non HMO) | Admitting: Certified Registered"

## 2013-08-24 DIAGNOSIS — K432 Incisional hernia without obstruction or gangrene: Secondary | ICD-10-CM | POA: Diagnosis not present

## 2013-08-24 DIAGNOSIS — E669 Obesity, unspecified: Secondary | ICD-10-CM | POA: Insufficient documentation

## 2013-08-24 DIAGNOSIS — Z86711 Personal history of pulmonary embolism: Secondary | ICD-10-CM | POA: Insufficient documentation

## 2013-08-24 DIAGNOSIS — Z888 Allergy status to other drugs, medicaments and biological substances status: Secondary | ICD-10-CM | POA: Insufficient documentation

## 2013-08-24 DIAGNOSIS — E785 Hyperlipidemia, unspecified: Secondary | ICD-10-CM | POA: Insufficient documentation

## 2013-08-24 DIAGNOSIS — D259 Leiomyoma of uterus, unspecified: Secondary | ICD-10-CM | POA: Insufficient documentation

## 2013-08-24 DIAGNOSIS — Z7901 Long term (current) use of anticoagulants: Secondary | ICD-10-CM | POA: Insufficient documentation

## 2013-08-24 DIAGNOSIS — Z87891 Personal history of nicotine dependence: Secondary | ICD-10-CM | POA: Insufficient documentation

## 2013-08-24 HISTORY — PX: UMBILICAL HERNIA REPAIR: SHX196

## 2013-08-24 SURGERY — REPAIR, HERNIA, UMBILICAL, ADULT
Anesthesia: General | Site: Abdomen

## 2013-08-24 MED ORDER — MIDAZOLAM HCL 2 MG/2ML IJ SOLN: 1.0000 mg | INTRAMUSCULAR | Status: DC | PRN

## 2013-08-24 MED ORDER — LACTATED RINGERS IV SOLN
INTRAVENOUS | Status: DC
  Administered 2013-08-24 (×3): via INTRAVENOUS

## 2013-08-24 MED ORDER — SCOPOLAMINE 1 MG/3DAYS TD PT72
1.0000 | MEDICATED_PATCH | Freq: Once | TRANSDERMAL | Status: DC
  Administered 2013-08-24: 1.5 mg via TRANSDERMAL

## 2013-08-24 MED ORDER — OXYCODONE HCL 5 MG PO TABS: 5.0000 mg | ORAL_TABLET | ORAL | Status: DC | PRN

## 2013-08-24 MED ORDER — BUPIVACAINE-EPINEPHRINE (PF) 0.5% -1:200000 IJ SOLN
INTRAMUSCULAR | Status: AC
  Filled 2013-08-24: qty 30

## 2013-08-24 MED ORDER — FENTANYL CITRATE 0.05 MG/ML IJ SOLN
INTRAMUSCULAR | Status: AC
  Filled 2013-08-24: qty 4

## 2013-08-24 MED ORDER — DEXAMETHASONE SODIUM PHOSPHATE 4 MG/ML IJ SOLN
INTRAMUSCULAR | Status: DC | PRN
  Administered 2013-08-24: 10 mg via INTRAVENOUS

## 2013-08-24 MED ORDER — SCOPOLAMINE 1 MG/3DAYS TD PT72
MEDICATED_PATCH | TRANSDERMAL | Status: AC
  Filled 2013-08-24: qty 1

## 2013-08-24 MED ORDER — LIDOCAINE-EPINEPHRINE (PF) 1 %-1:200000 IJ SOLN
INTRAMUSCULAR | Status: AC
  Filled 2013-08-24: qty 10

## 2013-08-24 MED ORDER — PROPOFOL 10 MG/ML IV BOLUS
INTRAVENOUS | Status: AC
  Filled 2013-08-24: qty 20

## 2013-08-24 MED ORDER — HYDROMORPHONE HCL PF 1 MG/ML IJ SOLN
0.2500 mg | INTRAMUSCULAR | Status: DC | PRN
  Administered 2013-08-24 (×4): 0.5 mg via INTRAVENOUS

## 2013-08-24 MED ORDER — FENTANYL CITRATE 0.05 MG/ML IJ SOLN: 50.0000 ug | INTRAMUSCULAR | Status: DC | PRN

## 2013-08-24 MED ORDER — LIDOCAINE HCL (CARDIAC) 20 MG/ML IV SOLN
INTRAVENOUS | Status: DC | PRN
  Administered 2013-08-24: 60 mg via INTRAVENOUS

## 2013-08-24 MED ORDER — HYDROCODONE-ACETAMINOPHEN 5-325 MG PO TABS: 1.0000 | ORAL_TABLET | Freq: Four times a day (QID) | ORAL | Status: DC | PRN

## 2013-08-24 MED ORDER — MIDAZOLAM HCL 5 MG/5ML IJ SOLN
INTRAMUSCULAR | Status: DC | PRN
  Administered 2013-08-24: 2 mg via INTRAVENOUS

## 2013-08-24 MED ORDER — FENTANYL CITRATE 0.05 MG/ML IJ SOLN: 25.0000 ug | INTRAMUSCULAR | Status: DC | PRN

## 2013-08-24 MED ORDER — PROMETHAZINE HCL 25 MG/ML IJ SOLN
INTRAMUSCULAR | Status: AC
  Filled 2013-08-24: qty 1

## 2013-08-24 MED ORDER — SODIUM BICARBONATE 4 % IV SOLN
INTRAVENOUS | Status: AC
  Filled 2013-08-24: qty 5

## 2013-08-24 MED ORDER — HYDROMORPHONE HCL PF 1 MG/ML IJ SOLN
INTRAMUSCULAR | Status: AC
  Filled 2013-08-24: qty 1

## 2013-08-24 MED ORDER — PROPOFOL 10 MG/ML IV BOLUS
INTRAVENOUS | Status: DC | PRN
  Administered 2013-08-24: 140 mg via INTRAVENOUS

## 2013-08-24 MED ORDER — SODIUM CHLORIDE 0.9 % IV SOLN: 250.0000 mL | INTRAVENOUS | Status: DC | PRN

## 2013-08-24 MED ORDER — FENTANYL CITRATE 0.05 MG/ML IJ SOLN
INTRAMUSCULAR | Status: DC | PRN
  Administered 2013-08-24: 100 ug via INTRAVENOUS

## 2013-08-24 MED ORDER — ACETAMINOPHEN 650 MG RE SUPP: 650.0000 mg | RECTAL | Status: DC | PRN

## 2013-08-24 MED ORDER — BUPIVACAINE-EPINEPHRINE 0.5% -1:200000 IJ SOLN
INTRAMUSCULAR | Status: DC | PRN
  Administered 2013-08-24: 6 mL

## 2013-08-24 MED ORDER — CEFAZOLIN SODIUM-DEXTROSE 2-3 GM-% IV SOLR
2.0000 g | INTRAVENOUS | Status: AC
  Administered 2013-08-24: 2 g via INTRAVENOUS

## 2013-08-24 MED ORDER — SODIUM CHLORIDE 0.9 % IV SOLN: INTRAVENOUS | Status: DC

## 2013-08-24 MED ORDER — CEFAZOLIN SODIUM-DEXTROSE 2-3 GM-% IV SOLR
INTRAVENOUS | Status: AC
  Filled 2013-08-24: qty 50

## 2013-08-24 MED ORDER — SODIUM CHLORIDE 0.9 % IJ SOLN: 3.0000 mL | INTRAMUSCULAR | Status: DC | PRN

## 2013-08-24 MED ORDER — SUCCINYLCHOLINE CHLORIDE 20 MG/ML IJ SOLN
INTRAMUSCULAR | Status: AC
  Filled 2013-08-24: qty 1

## 2013-08-24 MED ORDER — LIDOCAINE HCL 4 % MT SOLN
OROMUCOSAL | Status: DC | PRN
  Administered 2013-08-24: 2 mL via TOPICAL

## 2013-08-24 MED ORDER — ACETAMINOPHEN 325 MG PO TABS: 650.0000 mg | ORAL_TABLET | ORAL | Status: DC | PRN

## 2013-08-24 MED ORDER — OXYCODONE HCL 5 MG PO TABS: 5.0000 mg | ORAL_TABLET | Freq: Once | ORAL | Status: DC | PRN

## 2013-08-24 MED ORDER — ONDANSETRON HCL 4 MG/2ML IJ SOLN
INTRAMUSCULAR | Status: DC | PRN
  Administered 2013-08-24: 4 mg via INTRAVENOUS

## 2013-08-24 MED ORDER — OXYCODONE HCL 5 MG/5ML PO SOLN: 5.0000 mg | Freq: Once | ORAL | Status: DC | PRN

## 2013-08-24 MED ORDER — CHLORHEXIDINE GLUCONATE 4 % EX LIQD: 1.0000 "application " | Freq: Once | CUTANEOUS | Status: DC

## 2013-08-24 MED ORDER — MIDAZOLAM HCL 2 MG/2ML IJ SOLN
INTRAMUSCULAR | Status: AC
  Filled 2013-08-24: qty 2

## 2013-08-24 MED ORDER — PROMETHAZINE HCL 25 MG/ML IJ SOLN
6.2500 mg | Freq: Once | INTRAMUSCULAR | Status: AC
  Administered 2013-08-24: 6.25 mg via INTRAVENOUS

## 2013-08-24 MED ORDER — SODIUM CHLORIDE 0.9 % IJ SOLN
3.0000 mL | Freq: Two times a day (BID) | INTRAMUSCULAR | Status: DC
Start: 2013-08-24 — End: 2013-08-24

## 2013-08-24 MED ORDER — SUCCINYLCHOLINE CHLORIDE 20 MG/ML IJ SOLN
INTRAMUSCULAR | Status: DC | PRN
  Administered 2013-08-24: 100 mg via INTRAVENOUS

## 2013-08-24 SURGICAL SUPPLY — 52 items
APPLICATOR COTTON TIP 6IN STRL (MISCELLANEOUS) ×2 IMPLANT
BENZOIN TINCTURE PRP APPL 2/3 (GAUZE/BANDAGES/DRESSINGS) IMPLANT
BLADE CLIPPER SURG (BLADE) IMPLANT
BLADE HEX COATED 2.75 (ELECTRODE) ×2 IMPLANT
BLADE SURG 10 STRL SS (BLADE) ×2 IMPLANT
BLADE SURG 15 STRL LF DISP TIS (BLADE) ×1 IMPLANT
BLADE SURG 15 STRL SS (BLADE) ×1
CANISTER SUCT 1200ML W/VALVE (MISCELLANEOUS) IMPLANT
CHLORAPREP W/TINT 26ML (MISCELLANEOUS) ×2 IMPLANT
COVER MAYO STAND STRL (DRAPES) ×2 IMPLANT
COVER TABLE BACK 60X90 (DRAPES) ×2 IMPLANT
DECANTER SPIKE VIAL GLASS SM (MISCELLANEOUS) ×2 IMPLANT
DERMABOND ADVANCED (GAUZE/BANDAGES/DRESSINGS) ×1
DERMABOND ADVANCED .7 DNX12 (GAUZE/BANDAGES/DRESSINGS) ×1 IMPLANT
DRAPE PED LAPAROTOMY (DRAPES) ×2 IMPLANT
DRAPE UTILITY XL STRL (DRAPES) ×2 IMPLANT
ELECT REM PT RETURN 9FT ADLT (ELECTROSURGICAL) ×2
ELECTRODE REM PT RTRN 9FT ADLT (ELECTROSURGICAL) ×1 IMPLANT
GLOVE EUDERMIC 7 POWDERFREE (GLOVE) ×2 IMPLANT
GOWN STRL REUS W/ TWL LRG LVL3 (GOWN DISPOSABLE) ×1 IMPLANT
GOWN STRL REUS W/ TWL XL LVL3 (GOWN DISPOSABLE) ×1 IMPLANT
GOWN STRL REUS W/TWL LRG LVL3 (GOWN DISPOSABLE) ×1
GOWN STRL REUS W/TWL XL LVL3 (GOWN DISPOSABLE) ×1
MESH VENTRALEX ST 1-7/10 CRC S (Mesh General) ×2 IMPLANT
NEEDLE HYPO 25X1 1.5 SAFETY (NEEDLE) ×2 IMPLANT
PACK BASIN DAY SURGERY FS (CUSTOM PROCEDURE TRAY) ×2 IMPLANT
PENCIL BUTTON HOLSTER BLD 10FT (ELECTRODE) ×2 IMPLANT
SLEEVE SCD COMPRESS KNEE MED (MISCELLANEOUS) ×2 IMPLANT
SPONGE GAUZE 4X4 12PLY STER LF (GAUZE/BANDAGES/DRESSINGS) IMPLANT
SPONGE LAP 18X18 X RAY DECT (DISPOSABLE) IMPLANT
SPONGE LAP 4X18 X RAY DECT (DISPOSABLE) ×2 IMPLANT
STAPLER VISISTAT 35W (STAPLE) IMPLANT
STRIP CLOSURE SKIN 1/2X4 (GAUZE/BANDAGES/DRESSINGS) IMPLANT
STRIP CLOSURE SKIN 1/4X4 (GAUZE/BANDAGES/DRESSINGS) IMPLANT
SUT MNCRL AB 4-0 PS2 18 (SUTURE) ×2 IMPLANT
SUT NOVA 0 T19/GS 22DT (SUTURE) IMPLANT
SUT NOVA NAB DX-16 0-1 5-0 T12 (SUTURE) ×4 IMPLANT
SUT PDS AB 0 CT 36 (SUTURE) IMPLANT
SUT PROLENE 0 CT 1 CR/8 (SUTURE) IMPLANT
SUT PROLENE 0 CT 2 (SUTURE) IMPLANT
SUT VIC AB 2-0 CT1 27 (SUTURE)
SUT VIC AB 2-0 CT1 TAPERPNT 27 (SUTURE) IMPLANT
SUT VICRYL 3-0 CR8 SH (SUTURE) ×2 IMPLANT
SUT VICRYL 4-0 PS2 18IN ABS (SUTURE) ×2 IMPLANT
SUT VICRYL AB 2 0 TIE (SUTURE) IMPLANT
SUT VICRYL AB 2 0 TIES (SUTURE)
SYR BULB 3OZ (MISCELLANEOUS) IMPLANT
SYRINGE 10CC LL (SYRINGE) ×2 IMPLANT
TOWEL OR 17X24 6PK STRL BLUE (TOWEL DISPOSABLE) ×4 IMPLANT
TOWEL OR NON WOVEN STRL DISP B (DISPOSABLE) ×2 IMPLANT
TUBE CONNECTING 20X1/4 (TUBING) ×2 IMPLANT
YANKAUER SUCT BULB TIP NO VENT (SUCTIONS) ×2 IMPLANT

## 2013-08-24 NOTE — Anesthesia Preprocedure Evaluation (Signed)
Anesthesia Evaluation  Patient identified by MRN, date of birth, ID band Patient awake    Reviewed: Allergy & Precautions, H&P , NPO status , Patient's Chart, lab work & pertinent test results  Airway Mallampati: II TM Distance: >3 FB Neck ROM: Full    Dental no notable dental hx. (+) Teeth Intact, Dental Advisory Given   Pulmonary former smoker, PE breath sounds clear to auscultation  Pulmonary exam normal       Cardiovascular DVT Rhythm:Regular Rate:Normal     Neuro/Psych negative neurological ROS  negative psych ROS   GI/Hepatic negative GI ROS, Neg liver ROS,   Endo/Other  Morbid obesity  Renal/GU negative Renal ROS  negative genitourinary   Musculoskeletal   Abdominal   Peds  Hematology  (+) Blood dyscrasia, , Unspecified clotting disorder with h/o DVT and PE   Anesthesia Other Findings   Reproductive/Obstetrics negative OB ROS                           Anesthesia Physical Anesthesia Plan  ASA: III  Anesthesia Plan: General   Post-op Pain Management:    Induction: Intravenous  Airway Management Planned: Oral ETT  Additional Equipment:   Intra-op Plan:   Post-operative Plan: Extubation in OR  Informed Consent: I have reviewed the patients History and Physical, chart, labs and discussed the procedure including the risks, benefits and alternatives for the proposed anesthesia with the patient or authorized representative who has indicated his/her understanding and acceptance.   Dental advisory given  Plan Discussed with: CRNA  Anesthesia Plan Comments:         Anesthesia Quick Evaluation

## 2013-08-24 NOTE — Anesthesia Procedure Notes (Signed)
Procedure Name: Intubation Date/Time: 08/24/2013 9:27 AM Performed by: Baxter Flattery Pre-anesthesia Checklist: Patient identified, Emergency Drugs available, Suction available and Patient being monitored Patient Re-evaluated:Patient Re-evaluated prior to inductionOxygen Delivery Method: Circle System Utilized Preoxygenation: Pre-oxygenation with 100% oxygen Intubation Type: IV induction Ventilation: Mask ventilation without difficulty Laryngoscope Size: Miller and 2 Grade View: Grade II Tube type: Oral Number of attempts: 1 Airway Equipment and Method: stylet,  oral airway and LTA kit utilized Placement Confirmation: ETT inserted through vocal cords under direct vision,  positive ETCO2 and breath sounds checked- equal and bilateral Secured at: 21 cm Tube secured with: Tape Dental Injury: Teeth and Oropharynx as per pre-operative assessment

## 2013-08-24 NOTE — Interval H&P Note (Signed)
History and Physical Interval Note:  08/24/2013 8:58 AM  [name]  has presented today for surgery, with the diagnosis of incisional umbilical hernia  The various methods of treatment have been discussed with the patient and family. After consideration of risks, benefits and other options for treatment, the patient has consented to  Procedure(s): OPEN REPAIR OF UMBILCAL HERNIA (N/A) (incisional)as a surgical intervention .  The patient's history has been reviewed, patient examined today, no change in status, stable for surgery.  I have reviewed the patient's chart and labs.  Questions were answered to the patient's satisfaction.     Adin Hector

## 2013-08-24 NOTE — Discharge Instructions (Signed)
Hernia Repair Care After These instructions give you information on caring for yourself after your procedure. Your doctor may also give you more specific instructions. Call your doctor if you have any problems or questions after your procedure. HOME CARE   You may have changes in your poops (bowel movements).  You may have loose or watery poop (diarrhea).  You may be not able to poop.  Your bowels will slowly get back to normal.  Do not eat any food that makes you sick to your stomach (nauseous). Eat small meals 4 to 6 times a day instead of 3 large ones.  Do not drink pop. It will give you gas.  Do not drink alcohol.  Do not lift anything heavier than 10 pounds. This is about the weight of a gallon of milk.  Do not do anything that makes you very tired for at least 6 weeks.  Do not get your wound wet for 2 days.  You may take a sponge bath during this time.  After 2 days you may take a shower. Gently pat your surgical cut (incision) dry with a towel. Do not rub it.  For men: You may have been given an athletic supporter (scrotal support) before you left the hospital. It holds your scrotum and testicles closer to your body so there is no strain on your wound. Wear the supporter until your doctor tells you that you do not need it anymore. GET HELP RIGHT AWAY IF:  You have watery poop, or cannot poop for more than 3 days.  You feel sick to your stomach or throw up (vomit) more than 2 or 3 times.  You have temperature by mouth above 102 F (38.9 C).  You see redness or puffiness (swelling) around your wound.  You see yellowish white fluid (pus) coming from your wound.  You see a bulge or bump in your lower belly (abdomen) or near your groin.  You develop a rash, trouble breathing, or any other symptoms from medicines taken. MAKE SURE YOU:  Understand these instructions.  Will watch your condition.  Will get help right away if your are not doing well or get  worse. Document Released: 12/13/2007 Document Revised: 03/24/2011 Document Reviewed: 12/13/2007 [facility] Patient Information 2015 [place], [place]. This information is not intended to replace advice given to you by your health care provider. Make sure you discuss any questions you have with your health care provider.           [facility] _______[facility]  UMBILICAL OR INGUINAL HERNIA REPAIR: POST OP INSTRUCTIONS  Always review your discharge instruction sheet given to you by the facility where your surgery was performed. IF YOU HAVE DISABILITY OR FAMILY LEAVE FORMS, YOU MUST BRING THEM TO THE OFFICE FOR PROCESSING.   DO NOT GIVE THEM TO YOUR DOCTOR.  1. A  prescription for pain medication may be given to you upon discharge.  Take your pain medication as prescribed, if needed.  If narcotic pain medicine is not needed, then you may take acetaminophen (Tylenol) or ibuprofen (Advil) as needed. 2. Take your usually prescribed medications unless otherwise directed. 3. If you need a refill on your pain medication, please contact your pharmacy.  They will contact our office to request authorization. Prescriptions will not be filled after 5 pm or on week-ends. 4. You should follow a light diet the first 24 hours after arrival home, such as soup and crackers, etc.  Be sure to include lots of fluids daily.  Resume your normal diet the day after surgery. 5. Most patients will experience some swelling and bruising around the umbilicus or in the groin and scrotum.  Ice packs and reclining will help.  Swelling and bruising can take several days to resolve.  6. It is common to experience some constipation if taking pain medication after surgery.  Increasing fluid intake and taking a stool softener (such as Colace) will usually help or prevent this problem from occurring.  A mild laxative (Milk of Magnesia or Miralax) should be taken according to package directions if there are no bowel movements  after 48 hours. 7. Unless discharge instructions indicate otherwise, you may remove your bandages 24-48 hours after surgery, and you may shower at that time.  You may have steri-strips (small skin tapes) in place directly over the incision.  These strips should be left on the skin for 7-10 days.  If your surgeon used skin glue on the incision, you may shower in 24 hours.  The glue will flake off over the next 2-3 weeks.  Any sutures or staples will be removed at the office during your follow-up visit. 8. ACTIVITIES:  You may resume regular (light) daily activities beginning the next day--such as daily self-care, walking, climbing stairs--gradually increasing activities as tolerated.  You may have sexual intercourse when it is comfortable.  Refrain from any heavy lifting or straining until approved by your doctor. a. You may drive when you are no longer taking prescription pain medication, you can comfortably wear a seatbelt, and you can safely maneuver your car and apply brakes. b. RETURN TO WORK:  __________________________________________________________ 9. You should see your doctor in the office for a follow-up appointment approximately 2-3 weeks after your surgery.  Make sure that you call for this appointment within a day or two after you arrive home to insure a convenient appointment time. 10. OTHER INSTRUCTIONS:  __________________________________________________________________________________________________________________________________________________________________________________________  WHEN TO CALL YOUR DOCTOR: 1. Fever over 101.0 2. Inability to urinate 3. Nausea and/or vomiting 4. Extreme swelling or bruising 5. Continued bleeding from incision. 6. Increased pain, redness, or drainage from the incision  The clinic staff is available to answer your questions during regular business hours.  Please dont hesitate to call and ask to speak to one of the nurses for clinical concerns.   If you have a medical emergency, go to the nearest emergency room or call 911.  A surgeon from [facility] is always on call at the hospital   [address], [place], [place], [place]  [address] ?  [address], [place],    [phone] ? [phone] ? FAX [phone] Web site: [email or web address]   Post Anesthesia Home Care Instructions  Activity: Get plenty of rest for the remainder of the day. A responsible adult should stay with you for 24 hours following the procedure.  For the next 24 hours, DO NOT: -Drive a car -Paediatric nurse -Drink alcoholic beverages -Take any medication unless instructed by your physician -Make any legal decisions or sign important papers.  Meals: Start with liquid foods such as gelatin or soup. Progress to regular foods as tolerated. Avoid greasy, spicy, heavy foods. If nausea and/or vomiting occur, drink only clear liquids until the nausea and/or vomiting subsides. Call your physician if vomiting continues.  Special Instructions/Symptoms: Your throat may feel dry or sore from the anesthesia or the breathing tube placed in your throat during surgery. If this causes discomfort, gargle with warm salt water. The discomfort should  disappear within 24 hours.

## 2013-08-24 NOTE — Op Note (Signed)
Patient Name:           [name]   Date of Surgery:        08/24/2013  Pre op Diagnosis:      Periumbilical incisional hernia  Post op Diagnosis:    Same  Procedure:                 Repair incisional hernia with mesh  Surgeon:                     Edsel Petrin. Dalbert Batman, M.D., [place]  Assistant:                      None  Operative Indications:   [name] is a 53 y.o. female. Referred by Dr. Vania Rea for evaluation and surgical repair of a hernia at the umbilicus, at the site of her previous laparoscopic surgery. The patient is on Coumadin chronically because she has had 2 separate episodes of pulmonary embolism. This is managed by her primary care physician, Dr. Andrea Hood in [place].  The patient has had 2 ectopic pregnancies. She's had a laparoscopic procedure from the infraumbilical incision. She has a Pfannenstiel incision. . She says it popped out a month ago it is painful at her umbilicus.  Patient has no prior history of hernia. Examination reveals a small hernia in the periumbilical region, seemingly incarcerated, small, well-healed transverse scar at lower umbilical rimShe's tolerating her diet. Normal bowel function.   Operative Findings:       There was a small, solitary hernia at the base of the infraumbilical incision. This was no bigger than 1 cm in size. I had to stretch this a little bit to insert my finger but once I did I could palpate 5 or 6 cm in all directions under there fascia and there were no other defects. It was therefore a solitary small incisional hernia at the umbilical location.  Procedure in Detail:          Following the induction of general endotracheal anesthesia the patient's abdomen was prepped and draped in a sterile fashion, surgical time out was performed and intravenous antibiotics were given. 0.5% Marcaine with epinephrine was used as local infiltration anesthetic. Transverse incision was made at the infraumbilical position, through the  old scar and extending laterally on each side. Dissection was carried down through the subcutaneous tissue down to the fascia where I encountered a palpable hernia defect. I dissected  the umbilicus away and cleaned  the defect off circumferentially. I trimmed the incarcerated preperitoneal fat out of the hernia defect. I opened the defect slightly and inserted my index finger completely under the fascia.   I palpated in all directions and felt no other defect. I repaired the hernia with an inlay mesh, 4.3 cm diameter ventralite-ST. Marland KitchenThe tails were cut off. I placed 4 mattress sutures of #1 Novofil down through the fascia, through the edge of the mesh and back up through the fascia.    Once all these were placed I rolled up the mesh and inserted it  into the preperitoneal space and lifted up  the sutures. The mesh spread out and deployed nicely under the fascia.    This was tight against the fascia without any defect. Novafil sutures were tied. It was irrigated. The fascia was closed transversely with interrupted #1 Novofil. Subcutaneous tissue was closed with 3-0 Vicryl sutures and skin closed with a running 4-0 Monocryl and Dermabond.  Ice pack was placed and the patient taken to PACU in stable condition. EBL 10 cc. Counts correct. Complications none.     Edsel Petrin. Dalbert Batman, M.D., FACS General and Minimally Invasive Surgery Breast and Colorectal Surgery  08/24/2013 10:15 AM

## 2013-08-24 NOTE — Transfer of Care (Signed)
Immediate Anesthesia Transfer of Care Note  Patient: [name]  Procedure(s) Performed: Procedure(s): OPEN REPAIR OF INCISIONAL UMBILCAL HERNIA (N/A)  Patient Location: PACU  Anesthesia Type:General  Level of Consciousness: awake, alert  and oriented  Airway & Oxygen Therapy: Patient Spontanous Breathing and Patient connected to face mask oxygen  Post-op Assessment: Report given to PACU RN, Post -op Vital signs reviewed and stable and Patient moving all extremities  Post vital signs: Reviewed and stable  Complications: No apparent anesthesia complications

## 2013-08-25 ENCOUNTER — Encounter (HOSPITAL_BASED_OUTPATIENT_CLINIC_OR_DEPARTMENT_OTHER): Payer: Self-pay | Admitting: General Surgery

## 2013-08-25 NOTE — Anesthesia Postprocedure Evaluation (Signed)
  Anesthesia Post-op Note  Patient: [name]  Procedure(s) Performed: Procedure(s): OPEN REPAIR OF INCISIONAL UMBILCAL HERNIA (N/A)  Patient Location: PACU  Anesthesia Type:General  Level of Consciousness: awake and alert   Airway and Oxygen Therapy: Patient Spontanous Breathing  Post-op Pain: mild  Post-op Assessment: Post-op Vital signs reviewed, Patient's Cardiovascular Status Stable and Respiratory Function Stable  Post-op Vital Signs: Reviewed  Filed Vitals:   08/24/13 1430  BP: 108/62  Pulse: 78  Temp: 36.4 C  Resp: 16    Complications: No apparent anesthesia complications

## 2013-09-15 ENCOUNTER — Encounter (INDEPENDENT_AMBULATORY_CARE_PROVIDER_SITE_OTHER): Payer: Self-pay

## 2013-11-07 ENCOUNTER — Other Ambulatory Visit: Payer: Self-pay | Admitting: Obstetrics & Gynecology

## 2014-04-28 ENCOUNTER — Ambulatory Visit
Admission: RE | Admit: 2014-04-28 | Discharge: 2014-04-28 | Disposition: A | Discharge status: Home / Self Care | Payer: Managed Care, Other (non HMO) | Source: Ambulatory Visit | Attending: Emergency Medicine | Admitting: Emergency Medicine

## 2014-04-28 ENCOUNTER — Other Ambulatory Visit: Payer: Self-pay | Admitting: Emergency Medicine

## 2014-04-28 DIAGNOSIS — M25521 Pain in right elbow: Secondary | ICD-10-CM

## 2014-05-25 ENCOUNTER — Other Ambulatory Visit: Payer: Self-pay | Admitting: Obstetrics & Gynecology

## 2014-05-25 DIAGNOSIS — R928 Other abnormal and inconclusive findings on diagnostic imaging of breast: Secondary | ICD-10-CM

## 2014-05-31 ENCOUNTER — Ambulatory Visit
Admission: RE | Admit: 2014-05-31 | Discharge: 2014-05-31 | Disposition: A | Discharge status: Home / Self Care | Payer: Managed Care, Other (non HMO) | Source: Ambulatory Visit | Attending: Obstetrics & Gynecology | Admitting: Obstetrics & Gynecology

## 2014-05-31 DIAGNOSIS — R928 Other abnormal and inconclusive findings on diagnostic imaging of breast: Secondary | ICD-10-CM

## 2015-06-15 ENCOUNTER — Other Ambulatory Visit: Payer: Self-pay | Admitting: Obstetrics & Gynecology

## 2015-06-18 LAB — CYTOLOGY - PAP

## 2015-07-23 ENCOUNTER — Other Ambulatory Visit: Payer: Self-pay | Admitting: Obstetrics & Gynecology

## 2020-01-18 ENCOUNTER — Emergency Department (INDEPENDENT_AMBULATORY_CARE_PROVIDER_SITE_OTHER)
Admission: EM | Admit: 2020-01-18 | Discharge: 2020-01-18 | Disposition: A | Discharge status: Home / Self Care | Payer: BC Managed Care – PPO | Source: Home / Self Care

## 2020-01-18 ENCOUNTER — Other Ambulatory Visit: Payer: Self-pay

## 2020-01-18 ENCOUNTER — Emergency Department (INDEPENDENT_AMBULATORY_CARE_PROVIDER_SITE_OTHER): Payer: BC Managed Care – PPO

## 2020-01-18 DIAGNOSIS — R1115 Cyclical vomiting syndrome unrelated to migraine: Secondary | ICD-10-CM

## 2020-01-18 DIAGNOSIS — R0602 Shortness of breath: Secondary | ICD-10-CM

## 2020-01-18 DIAGNOSIS — Z1152 Encounter for screening for COVID-19: Secondary | ICD-10-CM | POA: Diagnosis not present

## 2020-01-18 DIAGNOSIS — R059 Cough, unspecified: Secondary | ICD-10-CM

## 2020-01-18 DIAGNOSIS — R509 Fever, unspecified: Secondary | ICD-10-CM

## 2020-01-18 MED ORDER — PROMETHAZINE-DM 6.25-15 MG/5ML PO SYRP: 5.0000 mL | ORAL_SOLUTION | Freq: Four times a day (QID) | ORAL | 0 refills | Status: DC | PRN

## 2020-01-18 MED ORDER — METOCLOPRAMIDE HCL 10 MG PO TABS: 10.0000 mg | ORAL_TABLET | Freq: Four times a day (QID) | ORAL | 0 refills | Status: DC | PRN

## 2020-01-18 MED ORDER — METOCLOPRAMIDE HCL 5 MG/ML IJ SOLN
5.0000 mg | Freq: Once | INTRAMUSCULAR | Status: AC
  Administered 2020-01-18: 5 mg via INTRAVENOUS

## 2020-01-18 MED ORDER — SODIUM CHLORIDE 0.9 % IV BOLUS
1000.0000 mL | Freq: Once | INTRAVENOUS | Status: AC
  Administered 2020-01-18: 1000 mL via INTRAVENOUS

## 2020-01-18 MED ORDER — PREDNISONE 20 MG PO TABS: 40.0000 mg | ORAL_TABLET | Freq: Every day | ORAL | 0 refills | Status: DC

## 2020-01-18 MED ORDER — METHYLPREDNISOLONE SODIUM SUCC 125 MG IJ SOLR
125.0000 mg | Freq: Once | INTRAMUSCULAR | Status: AC
  Administered 2020-01-18: 125 mg via INTRAVENOUS

## 2020-01-18 MED ORDER — ONDANSETRON 8 MG PO TBDP: 8.0000 mg | ORAL_TABLET | Freq: Three times a day (TID) | ORAL | 0 refills | Status: DC | PRN

## 2020-01-18 MED ORDER — ONDANSETRON HCL 4 MG/2ML IJ SOLN
2.0000 mg | Freq: Once | INTRAMUSCULAR | Status: AC
  Administered 2020-01-18: 2 mg via INTRAVENOUS

## 2020-01-18 MED ORDER — ALBUTEROL SULFATE HFA 108 (90 BASE) MCG/ACT IN AERS: INHALATION_SPRAY | RESPIRATORY_TRACT | 1 refills | Status: DC

## 2020-01-18 MED ORDER — FAMOTIDINE 20 MG PO TABS: 20.0000 mg | ORAL_TABLET | Freq: Two times a day (BID) | ORAL | 0 refills | Status: DC | PRN

## 2020-01-18 NOTE — Discharge Instructions (Addendum)
Your Covid test will result within 3 to 5 days.  Given your symptoms and fever today I suspect your symptoms are related to a COVID-19 infection.  If you develop any worsening shortness of breath, chest pain or pressure go immediately to the closest emergency department for evaluation. I am referring you to our COVID infusion clinic  as you are high risk for complications related to COVID-19 someone will reach out to you from the clinic if you are eligible for any of the new treatments or infusions. Complete all medications as prescribed.

## 2020-01-18 NOTE — ED Triage Notes (Addendum)
Pt presents to Urgent Care with c/o fever, cough, and vomiting x 5 days. Pt also reports abdominal pain and generalized body aches, including chest pain, particularly when coughing. Pt w/ no known COVID exposure; has not been vaccinated against COVID or flu.

## 2020-01-18 NOTE — ED Provider Notes (Addendum)
[facility]    CSN: [id number] Arrival date & time: 01/18/20  1342      History   Chief Complaint Chief Complaint  Patient presents with  . Emesis  . Fever  . Cough    HPI [name] is a 60 y.o. female.   HPI  Patient presents today with symptoms of feeling fatigue and febrile initially started 5 days ago.  Over the last 3 days patient developed generalized body aches, shortness of breath, upset stomach, chest pressure which worsens with coughing.  Cough has been productive over the last 2 days.  Patient has been intolerant of food and beverages for the last 48 hours.  She endorses taste and smell.  She has had nasal drainage and sore throat.  She is unvaccinated and has associated high risk conditions of hypercoagulability (with history of DVT and PE), obesity, and recurrent pneumonia.  Patient is on chronic anticoagulation.  She is unaware of any known sick contacts however she works outside the home.  Since arrival she has been persistently nauseous and has started vomitus which has been mostly mixed with mucus.  Past Medical History:  Diagnosis Date  . Clotting disorder ([place])   . DVT (deep venous thrombosis) ([place])   . Ectopic pregnancy   . Fibroids   . Hyperlipidemia   . PE (pulmonary embolism)   . Umbilical hernia     Patient Active Problem List   Diagnosis Date Noted  . Incisional hernia, without obstruction or gangrene 08/08/2013  . SKIN LESION 11/05/2009  . INSOMNIA, PERSISTENT 11/06/2006  . ALLERGIC RHINITIS 11/06/2006  . OBESITY NOS 09/30/2006  . ANXIETY 09/30/2006  . HYPERTENSION 09/30/2006  . PE 09/05/2006    Past Surgical History:  Procedure Laterality Date  . DIAGNOSTIC LAPAROSCOPY  [id number]   ectopic preg  . EYE SURGERY     rt   . KNEE SURGERY  2000   right  . UMBILICAL HERNIA REPAIR N/A 08/24/2013   Procedure: OPEN REPAIR OF INCISIONAL UMBILCAL HERNIA;  Surgeon: Adin Hector, MD;  Location: [facility];   Service: General;  Laterality: N/A;    OB History   No obstetric history on file.      Home Medications    Prior to Admission medications   Medication Sig Start Date End Date Taking? Authorizing Provider  enoxaparin (LOVENOX) 120 MG/0.8ML injection Inject 120 mg into the skin.    [provider]  HYDROcodone-acetaminophen (NORCO) 5-325 MG per tablet Take 1-2 tablets by mouth every 6 (six) hours as needed for moderate pain or severe pain. 08/24/13   Fanny Skates, MD  warfarin (COUMADIN) 10 MG tablet Take 10 mg by mouth daily. Tues, Thurs, Sat, Sun    [provider]  warfarin (COUMADIN) 2.5 MG tablet Take 15 mg by mouth daily. On MWF    [provider]    Family History Family History  Problem Relation Age of Onset  . Rheum arthritis Mother   . Cancer Father     Social History Social History   Tobacco Use  . Smoking status: Former Smoker    Types: Cigarettes    Quit date: 08/09/2003    Years since quitting: 16.4  . Smokeless tobacco: Never Used  Substance Use Topics  . Alcohol use: Yes    Comment: rare  . Drug use: No     Allergies   Pregabalin  Review of Systems Review of Systems Pertinent negatives listed in HPI  Physical Exam  Triage Vital Signs ED Triage Vitals  Enc Vitals Group     BP 01/18/20 1709 138/80     Pulse Rate 01/18/20 1709 (!) 115     Resp 01/18/20 1709 (!) 24     Temp 01/18/20 1709 99.3 F (37.4 C)     Temp Source 01/18/20 1709 Oral     SpO2 01/18/20 1709 97 %     Weight 01/18/20 1659 240 lb (108.9 kg)     Height 01/18/20 1659 5\' 6"  (1.676 m)     Head Circumference --      Peak Flow --      Pain Score 01/18/20 1659 10     Pain Loc --      Pain Edu? --      Excl. in GC? --    No data found.  Updated Vital Signs BP 116/74 (BP Location: Right Arm)   Pulse 91   Temp (!) 100.6 F (38.1 C) (Oral)   Resp 18   Ht 5\' 6"  (1.676 m)   Wt 240 lb (108.9 kg)   SpO2 95%   BMI 38.74 kg/m   Visual  Acuity Right Eye Distance:   Left Eye Distance:   Bilateral Distance:    Right Eye Near:   Left Eye Near:    Bilateral Near:     Physical Exam Constitutional:      General: She is in acute distress.     Appearance: She is obese. She is ill-appearing.     Comments: Persistent vomiting   Cardiovascular:     Rate and Rhythm: Normal rate and regular rhythm.  Musculoskeletal:     Cervical back: Normal range of motion.  Skin:    General: Skin is warm and dry.     Capillary Refill: Capillary refill takes less than 2 seconds.  Neurological:     General: No focal deficit present.     Mental Status: She is alert and oriented to person, place, and time.  Psychiatric:        Mood and Affect: Mood is anxious.      UC Treatments / Results  Labs (all labs ordered are listed, but only abnormal results are displayed) Labs Reviewed  COVID-19, FLU A+B NAA  POCT CBC W AUTO DIFF ([facility])    EKG   Radiology No results found.  Procedures Procedures (including critical care time)  Medications Ordered in UC Medications  sodium chloride 0.9 % bolus 1,000 mL (1,000 mLs Intravenous New Bag/Given 01/18/20 1729)  metoCLOPramide (REGLAN) injection 5 mg (5 mg Intravenous Given 01/18/20 1730)  methylPREDNISolone sodium succinate (SOLU-MEDROL) 125 mg/2 mL injection 125 mg (125 mg Intravenous Given 01/18/20 1736)  ondansetron (ZOFRAN) injection 2 mg (2 mg Intravenous Given 01/18/20 1736)    Initial Impression / Assessment and Plan / UC Course  I have reviewed the triage vital signs and the nursing notes.  Pertinent labs & imaging results that were available during my care of the patient were reviewed by me and considered in my medical decision making (see chart for details).    High suspicion for COVID-19 infection patient arrived to clinic projectile vomiting complaining of shortness of breath.  She is unvaccinated.  Vital signs stable.  Neck I did not patient received a liter of  fluids while here at urgent care today.  Collected a CBC which was unremarkable.  Chest x-ray was negative. Given unvaccinated status will refer for infusion clinic.  Strict ER precautions given to patient.  Advised  to manage fever with Tylenol Motrin.Patient is high risk for complications related to COVID given history of multiple PEs, DVTs along with obesity and unvaccinated status.   A total of 120 minutes spent, greater than 80 % of this time was spent proving direct care, re-evaluation, rendering treatment and coordination of care. Final Clinical Impressions(s) / UC Diagnoses   Final diagnoses:  Encounter for screening for COVID-19  Persistent vomiting in adult patient  Fever, unspecified     Discharge Instructions     Your Covid test will result within 3 to 5 days.  Given your symptoms and fever today I suspect your symptoms are related to a COVID-19 infection.  If you develop any worsening shortness of breath, chest pain or pressure go immediately to the closest emergency department for evaluation. I am referring you to our COVID infusion clinic  as you are high risk for complications related to COVID-19 someone will reach out to you from the clinic if you are eligible for any of the new treatments or infusions. Complete all medications as prescribed.    ED Prescriptions    Medication Sig Dispense Auth. Provider   predniSONE (DELTASONE) 20 MG tablet Take 2 tablets (40 mg total) by mouth daily with breakfast. 10 tablet Scot Jun, FNP   ondansetron (ZOFRAN-ODT) 8 MG disintegrating tablet Take 1 tablet (8 mg total) by mouth every 8 (eight) hours as needed for nausea. 30 tablet Scot Jun, FNP   promethazine-dextromethorphan (PROMETHAZINE-DM) 6.25-15 MG/5ML syrup Take 5 mLs by mouth 4 (four) times daily as needed for cough. 140 mL Scot Jun, FNP   metoCLOPramide (REGLAN) 10 MG tablet Take 1 tablet (10 mg total) by mouth every 6 (six) hours as needed for nausea.  30 tablet Scot Jun, FNP   famotidine (PEPCID) 20 MG tablet Take 1 tablet (20 mg total) by mouth 2 (two) times daily as needed for up to 8 days for heartburn or indigestion. 16 tablet Scot Jun, FNP   albuterol (VENTOLIN HFA) 108 (90 Base) MCG/ACT inhaler Inhale 2 puffs every 4-6 hours as needed for shortness of breath and wheezing. 8 g Scot Jun, FNP     PDMP not reviewed this encounter.   Scot Jun, FNP 01/20/20 0821    Scot Jun, [place] 01/20/20 [phone]

## 2020-01-19 ENCOUNTER — Telehealth: Payer: Self-pay

## 2020-01-19 LAB — POCT CBC W AUTO DIFF (K'VILLE URGENT CARE)

## 2020-01-19 MED ORDER — PREDNISONE 20 MG PO TABS: 40.0000 mg | ORAL_TABLET | Freq: Every day | ORAL | 0 refills | Status: DC

## 2020-01-19 MED ORDER — ALBUTEROL SULFATE HFA 108 (90 BASE) MCG/ACT IN AERS: INHALATION_SPRAY | RESPIRATORY_TRACT | 1 refills | Status: DC

## 2020-01-19 MED ORDER — FAMOTIDINE 20 MG PO TABS: 20.0000 mg | ORAL_TABLET | Freq: Two times a day (BID) | ORAL | 0 refills | Status: DC | PRN

## 2020-01-19 MED ORDER — METOCLOPRAMIDE HCL 10 MG PO TABS: 10.0000 mg | ORAL_TABLET | Freq: Four times a day (QID) | ORAL | 0 refills | Status: DC | PRN

## 2020-01-19 MED ORDER — ONDANSETRON 8 MG PO TBDP: 8.0000 mg | ORAL_TABLET | Freq: Three times a day (TID) | ORAL | 0 refills | Status: DC | PRN

## 2020-01-19 NOTE — Telephone Encounter (Signed)
Medication prescriptions changed to [facility] on [address] per patient request. Unable to transfer cough syrup related to narcotic ingredient. All others transferred.

## 2020-01-20 ENCOUNTER — Telehealth (HOSPITAL_COMMUNITY): Payer: Self-pay

## 2020-01-20 LAB — COVID-19, FLU A+B NAA
Influenza A, NAA: NOT DETECTED
Influenza B, NAA: NOT DETECTED
SARS-CoV-2, NAA: DETECTED — AB

## 2020-01-20 MED ORDER — PROMETHAZINE-DM 6.25-15 MG/5ML PO SYRP: 5.0000 mL | ORAL_SOLUTION | Freq: Four times a day (QID) | ORAL | 0 refills | Status: DC | PRN

## 2020-01-21 NOTE — Telephone Encounter (Signed)
[facility] has not been able to pick up cough medicine prescribed - [facility] [address] does not [facility] medicine - pt states medicine was transferred to [facility] but is still not ready - pt coughing through out call.  [facility] staff will check back on Sunday to see if script was able to be filled.

## 2020-01-22 ENCOUNTER — Telehealth: Payer: Self-pay

## 2020-01-22 MED ORDER — BENZONATATE 100 MG PO CAPS: 200.0000 mg | ORAL_CAPSULE | Freq: Three times a day (TID) | ORAL | 0 refills | Status: DC | PRN

## 2020-01-22 NOTE — Telephone Encounter (Signed)
Patient notified of cough medication change to pharmacy of choice. Verbalized understanding.

## 2020-01-22 NOTE — Telephone Encounter (Signed)
Benzonatate pearls sent to [facility].

## 2020-01-24 ENCOUNTER — Telehealth: Payer: Self-pay

## 2020-01-24 NOTE — Telephone Encounter (Signed)
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 Per Lavell Anchors, please call [facility] for follow up. Advised pt to call back if she didn't get an appt for today or tomorrow.

## 2020-01-25 ENCOUNTER — Emergency Department (HOSPITAL_COMMUNITY)
Admission: EM | Admit: 2020-01-25 | Discharge: 2020-01-26 | Disposition: A | Discharge status: Home / Self Care | Payer: BC Managed Care – PPO | Attending: Emergency Medicine | Admitting: Emergency Medicine

## 2020-01-25 ENCOUNTER — Other Ambulatory Visit: Payer: Self-pay

## 2020-01-25 DIAGNOSIS — D7389 Other diseases of spleen: Secondary | ICD-10-CM

## 2020-01-25 DIAGNOSIS — R509 Fever, unspecified: Secondary | ICD-10-CM | POA: Diagnosis present

## 2020-01-25 DIAGNOSIS — Z79899 Other long term (current) drug therapy: Secondary | ICD-10-CM | POA: Diagnosis not present

## 2020-01-25 DIAGNOSIS — Z7901 Long term (current) use of anticoagulants: Secondary | ICD-10-CM | POA: Insufficient documentation

## 2020-01-25 DIAGNOSIS — Z86711 Personal history of pulmonary embolism: Secondary | ICD-10-CM | POA: Diagnosis not present

## 2020-01-25 DIAGNOSIS — Z87891 Personal history of nicotine dependence: Secondary | ICD-10-CM | POA: Insufficient documentation

## 2020-01-25 DIAGNOSIS — U071 COVID-19: Secondary | ICD-10-CM | POA: Diagnosis not present

## 2020-01-25 DIAGNOSIS — N950 Postmenopausal bleeding: Secondary | ICD-10-CM

## 2020-01-25 DIAGNOSIS — R112 Nausea with vomiting, unspecified: Secondary | ICD-10-CM | POA: Insufficient documentation

## 2020-01-25 DIAGNOSIS — N939 Abnormal uterine and vaginal bleeding, unspecified: Secondary | ICD-10-CM | POA: Insufficient documentation

## 2020-01-25 DIAGNOSIS — I10 Essential (primary) hypertension: Secondary | ICD-10-CM | POA: Insufficient documentation

## 2020-01-25 DIAGNOSIS — R7401 Elevation of levels of liver transaminase levels: Secondary | ICD-10-CM

## 2020-01-25 DIAGNOSIS — R1012 Left upper quadrant pain: Secondary | ICD-10-CM | POA: Diagnosis not present

## 2020-01-25 DIAGNOSIS — D72819 Decreased white blood cell count, unspecified: Secondary | ICD-10-CM

## 2020-01-25 LAB — I-STAT BETA HCG BLOOD, ED (MC, WL, AP ONLY): I-stat hCG, quantitative: <5 m[IU]/mL (ref <5)

## 2020-01-25 LAB — COMPREHENSIVE METABOLIC PANEL
ALT: 58 U/L — ABNORMAL HIGH (ref 0–44)
AST: 72 U/L — ABNORMAL HIGH (ref 15–41)
Albumin: 3.4 g/dL — ABNORMAL LOW (ref 3.5–5.0)
Alkaline Phosphatase: 53 U/L (ref 38–126)
Anion gap: 11 (ref 5–15)
BUN: 13 mg/dL (ref 6–20)
CO2: 22 mmol/L (ref 22–32)
Calcium: 8.6 mg/dL — ABNORMAL LOW (ref 8.9–10.3)
Chloride: 102 mmol/L (ref 98–111)
Creatinine, Ser: 1.48 mg/dL — ABNORMAL HIGH (ref 0.44–1.00)
GFR, Estimated: 41 mL/min — ABNORMAL LOW (ref >60)
Glucose, Bld: 102 mg/dL — ABNORMAL HIGH (ref 70–99)
Potassium: 4.3 mmol/L (ref 3.5–5.1)
Sodium: 135 mmol/L (ref 135–145)
Total Bilirubin: 0.5 mg/dL (ref 0.3–1.2)
Total Protein: 7.3 g/dL (ref 6.5–8.1)

## 2020-01-25 LAB — URINALYSIS, ROUTINE W REFLEX MICROSCOPIC
Glucose, UA: NEGATIVE mg/dL
Ketones, ur: 15 mg/dL — AB
Nitrite: NEGATIVE
Protein, ur: >300 mg/dL — AB
Specific Gravity, Urine: >1.03 — ABNORMAL HIGH (ref 1.005–1.030)
pH: 5 (ref 5.0–8.0)

## 2020-01-25 LAB — CBC
HCT: 41.8 % (ref 36.0–46.0)
Hemoglobin: 13.4 g/dL (ref 12.0–15.0)
MCH: 29.1 pg (ref 26.0–34.0)
MCHC: 32.1 g/dL (ref 30.0–36.0)
MCV: 90.9 fL (ref 80.0–100.0)
Platelets: 201 10*3/uL (ref 150–400)
RBC: 4.6 MIL/uL (ref 3.87–5.11)
RDW: 15.7 % — ABNORMAL HIGH (ref 11.5–15.5)
WBC: 3.5 10*3/uL — ABNORMAL LOW (ref 4.0–10.5)
nRBC: 0 % (ref 0.0–0.2)

## 2020-01-25 LAB — URINALYSIS, MICROSCOPIC (REFLEX)

## 2020-01-25 LAB — LIPASE, BLOOD: Lipase: 32 U/L (ref 11–51)

## 2020-01-25 NOTE — ED Triage Notes (Signed)
Pt with persistent cough since Thanksgiving, dx with covid on 1/5. Pt also post-menopausal but having painless vaginal bleeding since Thanksgiving as well. No bowel movement in one week, also has not been eating much d/t nausea and vomiting since Thanksgiving. Also L flank pain and frequent urination but denies burning or foul smell.

## 2020-01-26 ENCOUNTER — Emergency Department (HOSPITAL_COMMUNITY): Payer: BC Managed Care – PPO

## 2020-01-26 LAB — RESP PANEL BY RT-PCR (FLU A&B, COVID) ARPGX2
Influenza A by PCR: NEGATIVE
Influenza B by PCR: NEGATIVE
SARS Coronavirus 2 by RT PCR: POSITIVE — AB

## 2020-01-26 LAB — PROTIME-INR
INR: 1.7 — ABNORMAL HIGH (ref 0.8–1.2)
Prothrombin Time: 19.2 seconds — ABNORMAL HIGH (ref 11.4–15.2)

## 2020-01-26 MED ORDER — IOHEXOL 300 MG/ML  SOLN
100.0000 mL | Freq: Once | INTRAMUSCULAR | Status: AC | PRN
  Administered 2020-01-26: 100 mL via INTRAVENOUS

## 2020-01-26 MED ORDER — HYDROCODONE-ACETAMINOPHEN 5-325 MG PO TABS: 1.0000 | ORAL_TABLET | Freq: Four times a day (QID) | ORAL | 0 refills | Status: DC | PRN

## 2020-01-26 MED ORDER — LACTATED RINGERS IV BOLUS
1000.0000 mL | Freq: Once | INTRAVENOUS | Status: AC
  Administered 2020-01-26: 1000 mL via INTRAVENOUS

## 2020-01-26 MED ORDER — ALBUTEROL SULFATE HFA 108 (90 BASE) MCG/ACT IN AERS
2.0000 | INHALATION_SPRAY | Freq: Once | RESPIRATORY_TRACT | Status: AC
  Administered 2020-01-26: 2 via RESPIRATORY_TRACT
  Filled 2020-01-26: qty 6.7

## 2020-01-26 MED ORDER — BENZONATATE 100 MG PO CAPS
200.0000 mg | ORAL_CAPSULE | Freq: Once | ORAL | Status: AC
  Administered 2020-01-26: 200 mg via ORAL
  Filled 2020-01-26: qty 2

## 2020-01-26 MED ORDER — ONDANSETRON HCL 4 MG/2ML IJ SOLN
4.0000 mg | Freq: Once | INTRAMUSCULAR | Status: AC
  Administered 2020-01-26: 4 mg via INTRAVENOUS
  Filled 2020-01-26: qty 2

## 2020-01-26 NOTE — ED Notes (Signed)
Date and time results received: 01/26/20 0510 (use smartphrase ".now" to insert current time)  Test: COVID Critical Value: Positive  Name of Provider Notified: Roxanne Mins  Orders Received? Or Actions Taken?: Will continue to monitor patient.

## 2020-01-26 NOTE — Discharge Instructions (Addendum)
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  This is probably what is causing the bleeding.  You need to make a follow-up appointment with your gynecologist for further evaluation.  If your bleeding starts getting worse, you are welcome to return at any time.  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  They recommend repeat scan in 6-12 months to make sure that it is not growing.  Your primary care provider can arrange for this.  You are still having symptoms from COVID-19 infection.  You need to continue to isolate yourself as you are probably still contagious.  Return to the emergency department if you develop difficulty breathing or any other concerning symptoms.

## 2020-01-26 NOTE — ED Provider Notes (Signed)
[facility] EMERGENCY DEPARTMENT Provider Note   CSN: [id number] Arrival date & time: 01/25/20  1824   History Chief complaint: Left flank pain  [name] is a 60 y.o. female.  The history is provided by the patient.  She has history of hypertension, hyperlipidemia, pulmonary embolism anticoagulated on warfarin and comes in with multiple complaints.  She started getting sick around Thanksgiving with fever, cough, left flank pain, nausea, vomiting.  She was sick for 1-2 weeks before symptoms resolved only to recur around 11 days ago.  She states that she has had anorexia and can only eat about 4 bites and then she will vomit.  She estimates 10-15 pound weight loss during this time.  She has had fevers as high as 101.8 with associated chills and sweats.  There has been a cough which is almost constant and productive of moderate amount of clear to white sputum.  She continues to have pain in the left flank which radiates to the left upper abdomen which she rates at 8/10.  She denies any urinary difficulty or diarrhea.  She has been having vaginal bleeding which is dark red but without clots.  She denies any pelvic cramping.  She is at least 2 years postmenopausal.  Of note, she did test positive for COVID-19 on January 5.  Past Medical History:  Diagnosis Date  . Clotting disorder ([place])   . DVT (deep venous thrombosis) ([place])   . Ectopic pregnancy   . Fibroids   . Hyperlipidemia   . PE (pulmonary embolism)   . Umbilical hernia     Patient Active Problem List   Diagnosis Date Noted  . Incisional hernia, without obstruction or gangrene 08/08/2013  . SKIN LESION 11/05/2009  . INSOMNIA, PERSISTENT 11/06/2006  . ALLERGIC RHINITIS 11/06/2006  . OBESITY NOS 09/30/2006  . ANXIETY 09/30/2006  . HYPERTENSION 09/30/2006  . PE 09/05/2006    Past Surgical History:  Procedure Laterality Date  . DIAGNOSTIC LAPAROSCOPY  [id number]   ectopic preg  . EYE SURGERY     rt    . KNEE SURGERY  2000   right  . UMBILICAL HERNIA REPAIR N/A 08/24/2013   Procedure: OPEN REPAIR OF INCISIONAL UMBILCAL HERNIA;  Surgeon: Adin Hector, MD;  Location: [place];  Service: General;  Laterality: N/A;     OB History   No obstetric history on file.     Family History  Problem Relation Age of Onset  . Rheum arthritis Mother   . Cancer Father     Social History   Tobacco Use  . Smoking status: Former Smoker    Types: Cigarettes    Quit date: 08/09/2003    Years since quitting: 16.4  . Smokeless tobacco: Never Used  Substance Use Topics  . Alcohol use: Yes    Comment: rare  . Drug use: No    Home Medications Prior to Admission medications   Medication Sig Start Date End Date Taking? Authorizing Provider  albuterol (VENTOLIN HFA) 108 (90 Base) MCG/ACT inhaler Inhale 2 puffs every 4-6 hours as needed for shortness of breath and wheezing. 01/19/20   Scot Jun, FNP  benzonatate (TESSALON) 100 MG capsule Take 2 capsules (200 mg total) by mouth 3 (three) times daily as needed for cough. 01/22/20   Scot Jun, FNP  enoxaparin (LOVENOX) 120 MG/0.8ML injection Inject 120 mg into the skin.    [provider]  famotidine (PEPCID) 20 MG tablet Take 1 tablet (  20 mg total) by mouth 2 (two) times daily as needed for up to 8 days for heartburn or indigestion. 01/19/20 01/27/20  Scot Jun, FNP  HYDROcodone-acetaminophen (NORCO) 5-325 MG per tablet Take 1-2 tablets by mouth every 6 (six) hours as needed for moderate pain or severe pain. 08/24/13   Fanny Skates, MD  metoCLOPramide (REGLAN) 10 MG tablet Take 1 tablet (10 mg total) by mouth every 6 (six) hours as needed for nausea. 01/19/20   Scot Jun, FNP  ondansetron (ZOFRAN-ODT) 8 MG disintegrating tablet Take 1 tablet (8 mg total) by mouth every 8 (eight) hours as needed for nausea. 01/19/20   Scot Jun, FNP  predniSONE (DELTASONE) 20 MG tablet Take 2 tablets (40 mg  total) by mouth daily with breakfast. 01/19/20   Scot Jun, FNP  promethazine-dextromethorphan (PROMETHAZINE-DM) 6.25-15 MG/5ML syrup Take 5 mLs by mouth 4 (four) times daily as needed for cough. 01/20/20   Scot Jun, FNP  warfarin (COUMADIN) 10 MG tablet Take 10 mg by mouth daily. Tues, Thurs, Sat, Sun    [provider]  warfarin (COUMADIN) 2.5 MG tablet Take 15 mg by mouth daily. On MWF    [provider]    Allergies    Pregabalin  Review of Systems   Review of Systems  All other systems reviewed and are negative.   Physical Exam Updated Vital Signs BP 90/68   Pulse 79   Temp 99.2 F (37.3 C)   Resp (!) 26   SpO2 97%   Physical Exam Vitals and nursing note reviewed.   60 year old female, resting comfortably and in no acute distress. Vital signs are significant for borderline low blood pressure and elevated respiratory rate. Oxygen saturation is 97%, which is normal.  She is coughing almost constantly. Head is normocephalic and atraumatic. PERRLA, EOMI. Oropharynx is clear. Neck is nontender and supple without adenopathy or JVD. Back is nontender and there is no CVA tenderness. Lungs are clear without rales, wheezes, or rhonchi. Chest is nontender. Heart has regular rate and rhythm without murmur. Abdomen is soft, flat, nontender without masses or hepatosplenomegaly and peristalsis is hypoactive. Pelvic: Normal external female genitalia.  Small amount of dark red blood present in the vaginal vault.  Cervix is closed and appears normal.  Fundus is approximately 6 weeks size and somewhat irregular but nontender.  There is no cervical motion tenderness.  There are no adnexal masses or tenderness. Extremities have no cyanosis or edema, full range of motion is present. Skin is warm and dry without rash. Neurologic: Mental status is normal, cranial nerves are intact, there are no motor or sensory deficits.  ED Results / Procedures / Treatments    Labs (all labs ordered are listed, but only abnormal results are displayed) Labs Reviewed  RESP PANEL BY RT-PCR (FLU A&B, COVID) ARPGX2 - Abnormal; Notable for the following components:      Result Value   SARS Coronavirus 2 by RT PCR POSITIVE (*)    All other components within normal limits  COMPREHENSIVE METABOLIC PANEL - Abnormal; Notable for the following components:   Glucose, Bld 102 (*)    Creatinine, Ser 1.48 (*)    Calcium 8.6 (*)    Albumin 3.4 (*)    AST 72 (*)    ALT 58 (*)    GFR, Estimated 41 (*)    All other components within normal limits  CBC - Abnormal; Notable for the following components:   WBC 3.5 (*)  RDW 15.7 (*)    All other components within normal limits  URINALYSIS, ROUTINE W REFLEX MICROSCOPIC - Abnormal; Notable for the following components:   APPearance CLOUDY (*)    Specific Gravity, Urine >1.030 (*)    Hgb urine dipstick LARGE (*)    Bilirubin Urine SMALL (*)    Ketones, ur 15 (*)    Protein, ur >300 (*)    Leukocytes,Ua SMALL (*)    All other components within normal limits  URINALYSIS, MICROSCOPIC (REFLEX) - Abnormal; Notable for the following components:   Bacteria, UA RARE (*)    All other components within normal limits  PROTIME-INR - Abnormal; Notable for the following components:   Prothrombin Time 19.2 (*)    INR 1.7 (*)    All other components within normal limits  LIPASE, BLOOD  I-STAT BETA HCG BLOOD, ED ([facility], [facility], [facility] ONLY)  POC SARS CORONAVIRUS 2 AG -  ED   Radiology CT ABDOMEN PELVIS W CONTRAST  Result Date: 01/26/2020 CLINICAL DATA:  Unintended weight loss, pain in a sagittal bleeding for 2 months, constipation and nausea left flank pain and frequent urination EXAM: CT ABDOMEN AND PELVIS WITH CONTRAST TECHNIQUE: Multidetector CT imaging of the abdomen and pelvis was performed using the standard protocol following bolus administration of intravenous contrast. CONTRAST:  161mL OMNIPAQUE IOHEXOL 300 MG/ML  SOLN COMPARISON:  Chest  radiograph 01/26/2020, CT PA 09/05/2006 FINDINGS: Lower chest: Some dependent atelectatic changes are present posteriorly. Additional patchy areas of multifocal ground-glass are present in the lower lungs bilaterally, could reflect an acute infectious or inflammatory process including atypical viral etiologies. Normal heart size. No pericardial effusion. Hepatobiliary: Chronically diminutive appearance of the left lobe liver, similar to comparison CT in 2008. No worrisome focal liver lesions. Smooth liver surface contour. Normal hepatic attenuation. Normal gallbladder and biliary tree without visible calcified gallstone. Pancreas: No pancreatic ductal dilatation or surrounding inflammatory changes. Spleen: Solitary 6 mm transiently hyperattenuating focus in the posterior spleen (3/20), normalizing to background parenchymal attenuation on delayed phase, incompletely characterized but could reflect a small hemangioma. Normal splenic size. No other focal splenic lesions. Adrenals/Urinary Tract: 2 cm nodule left adrenal gland is not significantly changed from comparison in 2008 and likely reflects a benign adrenal adenoma. No other concerning focal adrenal nodules or masses. Kidneys are normally located with symmetric enhancementand excretion with bilateral extrarenal pelves. No suspicious renal lesion, urolithiasis or hydronephrosis. Urinary bladder is largely decompressed at the time of exam and therefore poorly evaluated by CT imaging. Mild wall thickening is nonspecific given underdistention though some mild mucosal hyperemia could suggest underlying cystitis. Stomach/Bowel: Small sliding-type hiatal hernia. Some mild gastric wall thickening may be related to underdistention. No small bowel thickening or dilatation. Normal duodenal sweep across the midline abdomen. Normal appendix in the right lower quadrant. No colonic dilatation or wall thickening. Vascular/Lymphatic: Atherosclerotic calcifications within the  abdominal aorta and branch vessels. No aneurysm or ectasia. No enlarged abdominopelvic lymph nodes. Reproductive: Heterogeneously enhancing 4.3 x 4.6 cm lesion situated predominantly along the left lateral aspect of the uterine body is predominantly intramural but with some distortion of the endometrium. Could reflect a leiomyoma though incompletely characterized on this exam. Additional smaller subserosal 1 cm focus at the anterior uterine fundus may reflect an additional small leiomyoma. No concerning adnexal lesions. Other: No abdominopelvic free fluid or free gas. No bowel containing hernias. Musculoskeletal: No acute osseous abnormality or suspicious osseous lesion. Transitional thoracolumbar vertebrae with rudimentary ribs versus elongated transverse processes. Schmorl's node  formation at what is presumed to be the T11 level. IMPRESSION: 1. Mild bladder wall thickening is nonspecific given underdistention though some mild mucosal hyperemia could suggest underlying cystitis. Correlate with urinalysis. 2. Heterogeneously enhancing 4.3 x 4.6 cm lesion situated predominantly along the left lateral aspect of the uterine body is predominantly intramural but with some distortion of the endometrium. Could reflect a leiomyoma though incompletely characterized on this exam. Consider further evaluation with pelvic ultrasound, particularly in the setting of dysfunctional uterine bleeding. Additional smaller focus towards the fundus could reflect additional tiny leiomyoma. 3. Small sliding-type hiatal hernia. Some mild gastric wall thickening may be related to underdistention. Correlate for symptoms of gastritis. 4. Patchy areas of multifocal ground-glass are present in the lower lungs bilaterally, could reflect an acute infectious or inflammatory process including atypical viral etiologies. 5. 2 cm left adrenal gland nodule is not significantly changed from comparison in comparison in 2008 and likely reflects a benign  adrenal adenoma. 6. Solitary 6 mm hyperattenuating focus in the posterior spleen, incompletely characterized but could reflect a small hemangioma. Could consider 6-12 month follow-up MR. This recommendation follows ACR consensus guidelines: White Paper of the ACR Incidental Findings Committee II on Splenic and Nodal Findings. J Am Coll Radiol 2013;10:789-794. 7. Aortic Atherosclerosis (ICD10-I70.0). Electronically Signed   By: Lovena Le M.D.   On: 01/26/2020 06:31   DG Chest Port 1 View  Result Date: 01/26/2020 CLINICAL DATA:  Cough and COVID-19 EXAM: PORTABLE CHEST 1 VIEW COMPARISON:  01/18/2020 FINDINGS: Mild bibasilar opacity. Lungs otherwise clear. Cardiomediastinal contours are normal. No pleural effusion or pneumothorax. IMPRESSION: Mild bibasilar opacity may indicate atelectasis or infection. Electronically Signed   By: Ulyses Jarred M.D.   On: 01/26/2020 03:07    Procedures Procedures   Medications Ordered in ED Medications  albuterol (VENTOLIN HFA) 108 (90 Base) MCG/ACT inhaler 2 puff (2 puffs Inhalation Given 01/26/20 0321)  lactated ringers bolus 1,000 mL (0 mLs Intravenous Stopped 01/26/20 0421)  ondansetron (ZOFRAN) injection 4 mg (4 mg Intravenous Given 01/26/20 0320)  benzonatate (TESSALON) capsule 200 mg (200 mg Oral Given 01/26/20 0344)  iohexol (OMNIPAQUE) 300 MG/ML solution 100 mL (100 mLs Intravenous Contrast Given 01/26/20 0617)    ED Course  I have reviewed the triage vital signs and the nursing notes.  Pertinent labs & imaging results that were available during my care of the patient were reviewed by me and considered in my medical decision making (see chart for details).  MDM Rules/Calculators/A&P Recent diagnosis of COVID-19 with ongoing cough.  Will check chest x-ray to rule out pneumonia, give therapeutic trial of albuterol.  We will also check COVID antigen to see if she is continuing to be shedding virus.  Left flank pain of uncertain cause.  With the constellation  of vomiting, weight loss, vaginal bleeding and very concerned about possibility of GYN malignancy.  Screening labs have been obtained at triage acute kidney injury with creatinine 1.48 compared with baseline of 0.9 in 2015 (also creatinine 0.9 on 09/28/2018 at [facility], seen on care everywhere) and also mild elevation of transaminases which is new.  CBC shows mild leukopenia which is nonspecific, but normal hemoglobin and platelet count.  Urinalysis shows no sign of infection but is positive for high specific gravity and protein in the urine.  Will send for CT of abdomen and pelvis.  Albuterol did not help her cough.  CT scan showed what appears to be a uterine fibroid which is probably the source of her  bleeding, exacerbated by anticoagulated state.  Also, incidental finding of lesion in the spleen which probably is a hemangioma.  She was given benzonatate for the cough which also did not seem to help.  CT scan did show infiltrates in the lung consistent with COVID-19.  She maintained adequate oxygen saturations throughout.  Her COVID test is still positive.  Since she is still symptomatic, she is given a note to be off work for an additional 5days.  She is referred back to her gynecologist for further evaluation of her bleeding, back to her PCP to arrange for follow-up CT scan to evaluate her splenic lesion.  She is given a prescription for hydrocodone-acetaminophen to take as needed to help suppress the cough.  Return precautions discussed.  [name] was evaluated in Emergency Department on 01/26/2020 for the symptoms described in the history of present illness. She was evaluated in the context of the global COVID-19 pandemic, which necessitated consideration that the patient might be at risk for infection with the SARS-CoV-2 virus that causes COVID-19. Institutional protocols and algorithms that pertain to the evaluation of patients at risk for COVID-19 are in a state of rapid change based on  information released by regulatory bodies including the CDC and federal and state organizations. These policies and algorithms were followed during the patient's care in the ED.  Final Clinical Impression(s) / ED Diagnoses Final diagnoses:  None    Rx / DC Orders ED Discharge Orders    None       Delora Fuel, MD [id number] [phone]

## 2020-11-07 ENCOUNTER — Other Ambulatory Visit (HOSPITAL_COMMUNITY): Payer: Self-pay | Admitting: Orthopedic Surgery

## 2020-11-07 DIAGNOSIS — Z86711 Personal history of pulmonary embolism: Secondary | ICD-10-CM

## 2020-11-08 ENCOUNTER — Ambulatory Visit (HOSPITAL_COMMUNITY)
Admission: RE | Admit: 2020-11-08 | Discharge: 2020-11-08 | Disposition: A | Discharge status: Home / Self Care | Payer: BC Managed Care – PPO | Source: Ambulatory Visit | Attending: Cardiology | Admitting: Cardiology

## 2020-11-08 ENCOUNTER — Other Ambulatory Visit: Payer: Self-pay

## 2020-11-08 DIAGNOSIS — Z86711 Personal history of pulmonary embolism: Secondary | ICD-10-CM | POA: Diagnosis not present

## 2020-11-19 ENCOUNTER — Ambulatory Visit: Payer: Self-pay | Admitting: Student

## 2020-11-19 DIAGNOSIS — M1711 Unilateral primary osteoarthritis, right knee: Secondary | ICD-10-CM

## 2020-11-29 NOTE — Patient Instructions (Addendum)
DUE TO COVID-19 ONLY ONE VISITOR IS ALLOWED TO COME WITH YOU AND STAY IN THE WAITING ROOM ONLY DURING PRE OP AND PROCEDURE.   **NO VISITORS ARE ALLOWED IN THE SHORT STAY AREA OR RECOVERY ROOM!!**  IF YOU WILL BE ADMITTED INTO THE HOSPITAL YOU ARE ALLOWED ONLY TWO SUPPORT PEOPLE DURING VISITATION HOURS ONLY (7AM -8PM)   The support person(s) may change daily. The support person(s) must pass our screening, gel in and out, and wear a mask at all times, including in the patient's room. Patients must also wear a mask when staff or their support person are in the room.  No visitors under the age of 66. Any visitor under the age of 62 must be accompanied by an adult.    COVID SWAB TESTING MUST BE COMPLETED ON:  12/11/20 **MUST PRESENT COMPLETED FORM AT TESTING SITE**    [facility] (backside of the building) Open 8am-3pm. No appointment needed. You are not required to quarantine, however you are required to wear a well-fitted mask when you are out and around people not in your household.  Hand Hygiene often Do NOT share personal items Notify your provider if you are in close contact with someone who has COVID or you develop fever 100.4 or greater, new onset of sneezing, cough, sore throat, shortness of breath or body aches.       Your procedure is scheduled on: 12/13/20   Report to [facility] Main Entrance    Report to admitting at 10:45 AM   Call this number if you have problems the morning of surgery [phone]   Do not eat food :After Midnight.   May have liquids until 10:30 AM day of surgery  CLEAR LIQUID DIET  Foods Allowed                                                                     Foods Excluded  Water, Black Coffee and tea (no milk or creamer)           liquids that you cannot  Plain Jell-O in any flavor  (No red)                                    see through such as: Fruit ices (not with fruit pulp)                                             milk, soups, orange juice              Iced Popsicles (No red)                                               All solid food  Apple juices Sports drinks like Gatorade (No red) Lightly seasoned clear broth or consume(fat free) Sugar    The day of surgery:  Drink ONE (1) Pre-Surgery Clear Ensure by 10:30 am the morning of surgery. Drink in one sitting. Do not sip.  This drink was given to you during your hospital  pre-op appointment visit. Nothing else to drink after completing the  Pre-Surgery Clear Ensure.          If you have questions, please contact your surgeon's office.     Oral Hygiene is also important to reduce your risk of infection.                                    Remember - BRUSH YOUR TEETH THE MORNING OF SURGERY WITH YOUR REGULAR TOOTHPASTE   Take these medicines the morning of surgery with A SIP OF WATER: None                              You may not have any metal on your body including hair pins, jewelry, and body piercing             Do not wear make-up, lotions, powders, perfumes, or deodorant  Do not wear nail polish including gel and S&S, artificial/acrylic nails, or any other type of covering on natural nails including finger and toenails. If you have artificial nails, gel coating, etc. that needs to be removed by a nail salon please have this removed prior to surgery or surgery may need to be canceled/ delayed if the surgeon/ anesthesia feels like they are unable to be safely monitored.   Do not shave  48 hours prior to surgery.    Do not bring valuables to the hospital. [place].   Bring small overnight bag day of surgery.   Special Instructions: Bring a copy of your healthcare power of attorney and living will documents         the day of surgery if you haven't scanned them before.              Please read over the following fact sheets you were given: IF YOU HAVE  QUESTIONS ABOUT YOUR PRE-OP INSTRUCTIONS PLEASE CALL [place] - Preparing for Surgery Before surgery, you can play an important role.  Because skin is not sterile, your skin needs to be as free of germs as possible.  You can reduce the number of germs on your skin by washing with CHG (chlorahexidine gluconate) soap before surgery.  CHG is an antiseptic cleaner which kills germs and bonds with the skin to continue killing germs even after washing. Please DO NOT use if you have an allergy to CHG or antibacterial soaps.  If your skin becomes reddened/irritated stop using the CHG and inform your nurse when you arrive at Short Stay. Do not shave (including legs and underarms) for at least 48 hours prior to the first CHG shower.  You may shave your face/neck.  Please follow these instructions carefully:  1.  Shower with CHG Soap the night before surgery and the  morning of surgery.  2.  If you choose to wash your hair, wash your hair first as usual with your normal  shampoo.  3.  After you shampoo, rinse your hair and body thoroughly to remove the shampoo.                             4.  Use CHG as you would any other liquid soap.  You can apply chg directly to the skin and wash.  Gently with a scrungie or clean washcloth.  5.  Apply the CHG Soap to your body ONLY FROM THE NECK DOWN.   Do   not use on face/ open                           Wound or open sores. Avoid contact with eyes, ears mouth and   genitals (private parts).                       Wash face,  Genitals (private parts) with your normal soap.             6.  Wash thoroughly, paying special attention to the area where your    surgery  will be performed.  7.  Thoroughly rinse your body with warm water from the neck down.  8.  DO NOT shower/wash with your normal soap after using and rinsing off the CHG Soap.                9.  Pat yourself dry with a clean towel.            10.  Wear clean pajamas.            11.   Place clean sheets on your bed the night of your first shower and do not  sleep with pets. Day of Surgery : Do not apply any lotions/deodorants the morning of surgery.  Please wear clean clothes to the hospital/surgery center.  FAILURE TO FOLLOW THESE INSTRUCTIONS MAY RESULT IN THE CANCELLATION OF YOUR SURGERY  PATIENT SIGNATURE_________________________________  NURSE SIGNATURE__________________________________  ________________________________________________________________________   [name]  An incentive spirometer is a tool that can help keep your lungs clear and active. This tool measures how well you are filling your lungs with each breath. Taking long deep breaths may help reverse or decrease the chance of developing breathing (pulmonary) problems (especially infection) following: A long period of time when you are unable to move or be active. BEFORE THE PROCEDURE  If the spirometer includes an indicator to show your best effort, your nurse or respiratory therapist will set it to a desired goal. If possible, sit up straight or lean slightly forward. Try not to slouch. Hold the incentive spirometer in an upright position. INSTRUCTIONS FOR USE  Sit on the edge of your bed if possible, or sit up as far as you can in bed or on a chair. Hold the incentive spirometer in an upright position. Breathe out normally. Place the mouthpiece in your mouth and seal your lips tightly around it. Breathe in slowly and as deeply as possible, raising the piston or the ball toward the top of the column. Hold your breath for 3-5 seconds or for as long as possible. Allow the piston or ball to fall to the bottom of the column. Remove the mouthpiece from your mouth and breathe out normally. Rest for a few seconds and repeat Steps 1 through 7 at least 10 times every 1-2 hours when you are awake. Take your time and take  a few normal breaths between deep breaths. The spirometer may include an  indicator to show your best effort. Use the indicator as a goal to work toward during each repetition. After each set of 10 deep breaths, practice coughing to be sure your lungs are clear. If you have an incision (the cut made at the time of surgery), support your incision when coughing by placing a pillow or rolled up towels firmly against it. Once you are able to get out of bed, walk around indoors and cough well. You may stop using the incentive spirometer when instructed by your caregiver.  RISKS AND COMPLICATIONS Take your time so you do not get dizzy or light-headed. If you are in pain, you may need to take or ask for pain medication before doing incentive spirometry. It is harder to take a deep breath if you are having pain. AFTER USE Rest and breathe slowly and easily. It can be helpful to keep track of a log of your progress. Your caregiver can provide you with a simple table to help with this. If you are using the spirometer at home, follow these instructions: Cross IF:  You are having difficultly using the spirometer. You have trouble using the spirometer as often as instructed. Your pain medication is not giving enough relief while using the spirometer. You develop fever of 100.5 F (38.1 C) or higher. SEEK IMMEDIATE MEDICAL CARE IF:  You cough up bloody sputum that had not been present before. You develop fever of 102 F (38.9 C) or greater. You develop worsening pain at or near the incision site. MAKE SURE YOU:  Understand these instructions. Will watch your condition. Will get help right away if you are not doing well or get worse. Document Released: 05/12/2006 Document Revised: 03/24/2011 Document Reviewed: 07/13/2006 [employer] Patient Information 2014 [employer], [place].   ________________________________________________________________________  WHAT IS A BLOOD TRANSFUSION? Blood Transfusion Information  A transfusion is the replacement of blood or some of its  parts. Blood is made up of multiple cells which provide different functions. Red blood cells carry oxygen and are used for blood loss replacement. White blood cells fight against infection. Platelets control bleeding. Plasma helps clot blood. Other blood products are available for specialized needs, such as hemophilia or other clotting disorders. BEFORE THE TRANSFUSION  Who gives blood for transfusions?  Healthy volunteers who are fully evaluated to make sure their blood is safe. This is blood bank blood. Transfusion therapy is the safest it has ever been in the practice of medicine. Before blood is taken from a donor, a complete history is taken to make sure that person has no history of diseases nor engages in risky social behavior (examples are intravenous drug use or sexual activity with multiple partners). The donor's travel history is screened to minimize risk of transmitting infections, such as malaria. The donated blood is tested for signs of infectious diseases, such as HIV and hepatitis. The blood is then tested to be sure it is compatible with you in order to minimize the chance of a transfusion reaction. If you or a relative donates blood, this is often done in anticipation of surgery and is not appropriate for emergency situations. It takes many days to process the donated blood. RISKS AND COMPLICATIONS Although transfusion therapy is very safe and saves many lives, the main dangers of transfusion include:  Getting an infectious disease. Developing a transfusion reaction. This is an allergic reaction to something in the blood you were given. Every  precaution is taken to prevent this. The decision to have a blood transfusion has been considered carefully by your caregiver before blood is given. Blood is not given unless the benefits outweigh the risks. AFTER THE TRANSFUSION Right after receiving a blood transfusion, you will usually feel much better and more energetic. This is especially  true if your red blood cells have gotten low (anemic). The transfusion raises the level of the red blood cells which carry oxygen, and this usually causes an energy increase. The nurse administering the transfusion will monitor you carefully for complications. HOME CARE INSTRUCTIONS  No special instructions are needed after a transfusion. You may find your energy is better. Speak with your caregiver about any limitations on activity for underlying diseases you may have. SEEK MEDICAL CARE IF:  Your condition is not improving after your transfusion. You develop redness or irritation at the intravenous (IV) site. SEEK IMMEDIATE MEDICAL CARE IF:  Any of the following symptoms occur over the next 12 hours: Shaking chills. You have a temperature by mouth above 102 F (38.9 C), not controlled by medicine. Chest, back, or muscle pain. People around you feel you are not acting correctly or are confused. Shortness of breath or difficulty breathing. Dizziness and fainting. You get a rash or develop hives. You have a decrease in urine output. Your urine turns a dark color or changes to pink, red, or brown. Any of the following symptoms occur over the next 10 days: You have a temperature by mouth above 102 F (38.9 C), not controlled by medicine. Shortness of breath. Weakness after normal activity. The white part of the eye turns yellow (jaundice). You have a decrease in the amount of urine or are urinating less often. Your urine turns a dark color or changes to pink, red, or brown. Document Released: 12/28/1999 Document Revised: 03/24/2011 Document Reviewed: 08/16/2007 [facility] Patient Information 2014 [place], [place].  _______________________________________________________________________

## 2020-11-29 NOTE — Progress Notes (Addendum)
COVID swab appointment: 12/11/20  COVID Vaccine Completed: no Date COVID Vaccine completed: Has received booster: COVID vaccine manufacturer: [place]   Date of COVID positive in last 90 days: no  PCP - Amador Cunas, FNP Cardiologist - n/a  Chest x-ray - 05/16/20 Care Everywhere EKG - 11/30/20 Epic/chart Stress Test - around time of ECHO per pt ECHO - 2008 Cardiac Cath - n/a Pacemaker/ICD device last checked: n/a Spinal Cord Stimulator: n/a  Sleep Study - n/a CPAP -   Fasting Blood Sugar - n/a Checks Blood Sugar _____ times a day  Blood Thinner Instructions: Lovenox, continue up until day of surgery, none day of surgery. Start Eliquis after Aspirin Instructions: Last Dose:  Activity level: Can go up a flight of stairs and perform activities of daily living without stopping and without symptoms of chest pain or shortness of breath. Difficulty with stairs due to pain    Anesthesia review: PT 19.2 and INR 1.7 routed to Dr. Lyla Glassing, PE, DVT, HTN  Patient denies shortness of breath, fever, cough and chest pain at PAT appointment   Patient verbalized understanding of instructions that were given to them at the PAT appointment. Patient was also instructed that they will need to review over the PAT instructions again at home before surgery.

## 2020-11-30 ENCOUNTER — Other Ambulatory Visit: Payer: Self-pay

## 2020-11-30 ENCOUNTER — Encounter (HOSPITAL_COMMUNITY): Payer: Self-pay

## 2020-11-30 ENCOUNTER — Encounter (HOSPITAL_COMMUNITY)
Admission: RE | Admit: 2020-11-30 | Discharge: 2020-11-30 | Disposition: A | Discharge status: Home / Self Care | Payer: BC Managed Care – PPO | Source: Ambulatory Visit | Attending: Orthopedic Surgery | Admitting: Orthopedic Surgery

## 2020-11-30 VITALS — BP 147/95 | HR 75 | Temp 98.1°F | Resp 14 | Ht 65.5 in

## 2020-11-30 DIAGNOSIS — M1711 Unilateral primary osteoarthritis, right knee: Secondary | ICD-10-CM

## 2020-11-30 DIAGNOSIS — Z01818 Encounter for other preprocedural examination: Secondary | ICD-10-CM | POA: Diagnosis present

## 2020-11-30 HISTORY — DX: Pneumonia, unspecified organism: J18.9

## 2020-11-30 LAB — CBC
HCT: 38.7 % (ref 36.0–46.0)
Hemoglobin: 12.1 g/dL (ref 12.0–15.0)
MCH: 29.4 pg (ref 26.0–34.0)
MCHC: 31.3 g/dL (ref 30.0–36.0)
MCV: 94.2 fL (ref 80.0–100.0)
Platelets: 256 10*3/uL (ref 150–400)
RBC: 4.11 MIL/uL (ref 3.87–5.11)
RDW: 14.4 % (ref 11.5–15.5)
WBC: 3.3 10*3/uL — ABNORMAL LOW (ref 4.0–10.5)
nRBC: 0 % (ref 0.0–0.2)

## 2020-11-30 LAB — COMPREHENSIVE METABOLIC PANEL
ALT: 12 U/L (ref 0–44)
AST: 15 U/L (ref 15–41)
Albumin: 4.1 g/dL (ref 3.5–5.0)
Alkaline Phosphatase: 52 U/L (ref 38–126)
Anion gap: 5 (ref 5–15)
BUN: 21 mg/dL — ABNORMAL HIGH (ref 6–20)
CO2: 26 mmol/L (ref 22–32)
Calcium: 9.1 mg/dL (ref 8.9–10.3)
Chloride: 106 mmol/L (ref 98–111)
Creatinine, Ser: 0.87 mg/dL (ref 0.44–1.00)
GFR, Estimated: >60 mL/min (ref >60)
Glucose, Bld: 90 mg/dL (ref 70–99)
Potassium: 4.3 mmol/L (ref 3.5–5.1)
Sodium: 137 mmol/L (ref 135–145)
Total Bilirubin: 0.5 mg/dL (ref 0.3–1.2)
Total Protein: 8 g/dL (ref 6.5–8.1)

## 2020-11-30 LAB — TYPE AND SCREEN
ABO/RH(D): A POS
Antibody Screen: NEGATIVE

## 2020-11-30 LAB — PROTIME-INR
INR: 1 (ref 0.8–1.2)
Prothrombin Time: 13.1 seconds (ref 11.4–15.2)

## 2020-11-30 LAB — SURGICAL PCR SCREEN
MRSA, PCR: NEGATIVE
Staphylococcus aureus: NEGATIVE

## 2020-12-03 NOTE — Progress Notes (Signed)
Anesthesia Chart Review   Case: [id number] Date/Time: 12/13/20 1315   Procedure: COMPUTER ASSISTED TOTAL KNEE ARTHROPLASTY (Right: Knee)   Anesthesia type: Spinal   Pre-op diagnosis: Right knee osteoarthritis   Location: [facility] ROOM 08 / [facility]   Surgeons: Rod Can, MD       DISCUSSION:60 y.o. former smoker with h/o PE, DVT, clotting disorder, right knee OA scheduled for above procedure 12/13/20 with Dr. Rod Can.   Seen by hematology 11/14/20. Prescribed Lovenox to take 2 weeks before surgery, hold DOS. She will be started on Eliquis post operatively.   Anticipate pt can proceed with planned procedure barring acute status change.   VS: BP (!) 147/95   Pulse 75   Temp 36.7 C (Oral)   Resp 14   Ht 5' 5.5" (1.664 m)   SpO2 100%   BMI 39.33 kg/m   PROVIDERS: Amador Cunas, FNP is PCP    LABS: Labs reviewed: Acceptable for surgery. (all labs ordered are listed, but only abnormal results are displayed)  Labs Reviewed  CBC - Abnormal; Notable for the following components:      Result Value   WBC 3.3 (*)    All other components within normal limits  COMPREHENSIVE METABOLIC PANEL - Abnormal; Notable for the following components:   BUN 21 (*)    All other components within normal limits  SURGICAL PCR SCREEN  PROTIME-INR  TYPE AND SCREEN     IMAGES:   EKG: 11/30/2020 Rate 77 bpm  NSR  CV:  Past Medical History:  Diagnosis Date   Clotting disorder (HCC)    DVT (deep venous thrombosis) (HCC)    Ectopic pregnancy    Fibroids    Hyperlipidemia    PE (pulmonary embolism)    Pneumonia    Umbilical hernia     Past Surgical History:  Procedure Laterality Date   DIAGNOSTIC LAPAROSCOPY  2007,2012   ectopic preg   EYE SURGERY     rt    KNEE SURGERY  92/11/9415   right   UMBILICAL HERNIA REPAIR N/A 08/24/2013   Procedure: OPEN REPAIR OF INCISIONAL UMBILCAL HERNIA;  Surgeon: Adin Hector, MD;  Location: [place];  Service: General;   Laterality: N/A;   WISDOM TOOTH EXTRACTION      MEDICATIONS:  albuterol (VENTOLIN HFA) 108 (90 Base) MCG/ACT inhaler   enoxaparin (LOVENOX) 150 MG/ML injection   metoCLOPramide (REGLAN) 10 MG tablet   ondansetron (ZOFRAN-ODT) 8 MG disintegrating tablet   promethazine-dextromethorphan (PROMETHAZINE-DM) 6.25-15 MG/5ML syrup   No current facility-administered medications for this encounter.     Konrad Felix Ward, PA-C [facility]-Surgical Testing [phone]

## 2020-12-10 ENCOUNTER — Ambulatory Visit: Payer: Self-pay | Admitting: Student

## 2020-12-10 NOTE — H&P (View-Only) (Signed)
TOTAL KNEE ADMISSION H&P  Patient is being admitted for right total knee arthroplasty.  Subjective:  Chief Complaint:right knee pain.  HPI: [name], 60 y.o. female, has a history of pain and functional disability in the right knee due to arthritis and has failed non-surgical conservative treatments for greater than 12 weeks to includeNSAID's and/or analgesics and activity modification.  Onset of symptoms was gradual, starting 3 years ago with gradually worsening course since that time. The patient noted no past surgery on the right knee(s).  Patient currently rates pain in the right knee(s) at 8 out of 10 with activity. Patient has worsening of pain with activity and weight bearing, pain that interferes with activities of daily living, and pain with passive range of motion.  Patient has evidence of subchondral cysts, subchondral sclerosis, and joint space narrowing by imaging studies. There is no active infection.  Patient Active Problem List   Diagnosis Date Noted   Incisional hernia, without obstruction or gangrene 08/08/2013   SKIN LESION 11/05/2009   INSOMNIA, PERSISTENT 11/06/2006   ALLERGIC RHINITIS 11/06/2006   OBESITY NOS 09/30/2006   ANXIETY 09/30/2006   HYPERTENSION 09/30/2006   PE 09/05/2006   Past Medical History:  Diagnosis Date   Clotting disorder ([place])    DVT (deep venous thrombosis) (HCC)    Ectopic pregnancy    Fibroids    Hyperlipidemia    PE (pulmonary embolism)    Pneumonia    Umbilical hernia     Past Surgical History:  Procedure Laterality Date   DIAGNOSTIC LAPAROSCOPY  2007,2012   ectopic preg   EYE SURGERY     rt    KNEE SURGERY  94/85/4627   right   UMBILICAL HERNIA REPAIR N/A 08/24/2013   Procedure: OPEN REPAIR OF INCISIONAL UMBILCAL HERNIA;  Surgeon: Adin Hector, MD;  Location: [facility];  Service: General;  Laterality: N/A;   WISDOM TOOTH EXTRACTION      Current Outpatient Medications  Medication Sig Dispense Refill  Last Dose   albuterol (VENTOLIN HFA) 108 (90 Base) MCG/ACT inhaler Inhale 2 puffs every 4-6 hours as needed for shortness of breath and wheezing. (Patient not taking: Reported on 11/27/2020) 8 g 1    enoxaparin (LOVENOX) 150 MG/ML injection Inject 150 mg into the skin daily.      metoCLOPramide (REGLAN) 10 MG tablet Take 1 tablet (10 mg total) by mouth every 6 (six) hours as needed for nausea. (Patient not taking: Reported on 11/27/2020) 30 tablet 0    ondansetron (ZOFRAN-ODT) 8 MG disintegrating tablet Take 1 tablet (8 mg total) by mouth every 8 (eight) hours as needed for nausea. (Patient not taking: Reported on 11/27/2020) 30 tablet 0    promethazine-dextromethorphan (PROMETHAZINE-DM) 6.25-15 MG/5ML syrup Take 5 mLs by mouth 4 (four) times daily as needed for cough. (Patient not taking: Reported on 11/27/2020) 140 mL 0    No current facility-administered medications for this visit.   Allergies  Allergen Reactions   Pregabalin Shortness Of Breath   Tramadol Nausea Only    Social History   Tobacco Use   Smoking status: Former    Types: Cigarettes    Quit date: 08/09/2003    Years since quitting: 17.3   Smokeless tobacco: Never  Substance Use Topics   Alcohol use: Yes    Comment: rare    Family History  Problem Relation Age of Onset   Rheum arthritis Mother    Cancer Father      Review of Systems  Musculoskeletal:  Positive for arthralgias.  All other systems reviewed and are negative.  Objective:  Physical Exam HENT:     Head: Normocephalic.  Eyes:     Pupils: Pupils are equal, round, and reactive to light.  Cardiovascular:     Rate and Rhythm: Normal rate.  Pulmonary:     Effort: Pulmonary effort is normal.  Abdominal:     Palpations: Abdomen is soft.  Genitourinary:    Comments: Deferred Musculoskeletal:        General: Tenderness present.     Cervical back: Normal range of motion.  Skin:    General: Skin is warm.  Neurological:     Mental Status: She is  alert and oriented to person, place, and time.  Psychiatric:        Behavior: Behavior normal.    Vital signs in last 24 hours: @VSRANGES @  Labs:   Estimated body mass index is 39.33 kg/m as calculated from the following:   Height as of 11/30/20: 5' 5.5" (1.664 m).   Weight as of 01/18/20: 108.9 kg.   Imaging Review Plain radiographs demonstrate severe degenerative joint disease of the right knee(s). The bone quality appears to be adequate for age and reported activity level.      Assessment/Plan:  End stage arthritis, right knee   The patient history, physical examination, clinical judgment of the provider and imaging studies are consistent with end stage degenerative joint disease of the right knee(s) and total knee arthroplasty is deemed medically necessary. The treatment options including medical management, injection therapy arthroscopy and arthroplasty were discussed at length. The risks and benefits of total knee arthroplasty were presented and reviewed. The risks due to aseptic loosening, infection, stiffness, patella tracking problems, thromboembolic complications and other imponderables were discussed. The patient acknowledged the explanation, agreed to proceed with the plan and consent was signed. Patient is being admitted for inpatient treatment for surgery, pain control, PT, OT, prophylactic antibiotics, VTE prophylaxis, progressive ambulation and ADL's and discharge planning. The patient is planning to be discharged  home after overnight observation     Patient's anticipated LOS is less than 2 midnights, meeting these requirements: - Younger than 53 - Lives within 1 hour of care - Has a competent adult at home to recover with post-op recover - NO history of  - Chronic pain requiring opiods  - Diabetes  - Coronary Artery Disease  - Heart failure  - Heart attack  - Stroke  - DVT/VTE  - Cardiac arrhythmia  - Respiratory Failure/COPD  - Renal failure  -  Anemia  - Advanced Liver disease

## 2020-12-10 NOTE — H&P (Signed)
TOTAL KNEE ADMISSION H&P  Patient is being admitted for right total knee arthroplasty.  Subjective:  Chief Complaint:right knee pain.  HPI: [name], 60 y.o. female, has a history of pain and functional disability in the right knee due to arthritis and has failed non-surgical conservative treatments for greater than 12 weeks to includeNSAID's and/or analgesics and activity modification.  Onset of symptoms was gradual, starting 3 years ago with gradually worsening course since that time. The patient noted no past surgery on the right knee(s).  Patient currently rates pain in the right knee(s) at 8 out of 10 with activity. Patient has worsening of pain with activity and weight bearing, pain that interferes with activities of daily living, and pain with passive range of motion.  Patient has evidence of subchondral cysts, subchondral sclerosis, and joint space narrowing by imaging studies. There is no active infection.  Patient Active Problem List   Diagnosis Date Noted   Incisional hernia, without obstruction or gangrene 08/08/2013   SKIN LESION 11/05/2009   INSOMNIA, PERSISTENT 11/06/2006   ALLERGIC RHINITIS 11/06/2006   OBESITY NOS 09/30/2006   ANXIETY 09/30/2006   HYPERTENSION 09/30/2006   PE 09/05/2006   Past Medical History:  Diagnosis Date   Clotting disorder ([place])    DVT (deep venous thrombosis) ([facility])    Ectopic pregnancy    Fibroids    Hyperlipidemia    PE (pulmonary embolism)    Pneumonia    Umbilical hernia     Past Surgical History:  Procedure Laterality Date   DIAGNOSTIC LAPAROSCOPY  2007,2012   ectopic preg   EYE SURGERY     rt    KNEE SURGERY  16/10/9602   right   UMBILICAL HERNIA REPAIR N/A 08/24/2013   Procedure: OPEN REPAIR OF INCISIONAL UMBILCAL HERNIA;  Surgeon: Adin Hector, MD;  Location: [facility];  Service: General;  Laterality: N/A;   WISDOM TOOTH EXTRACTION      Current Outpatient Medications  Medication Sig Dispense Refill  Last Dose   albuterol (VENTOLIN HFA) 108 (90 Base) MCG/ACT inhaler Inhale 2 puffs every 4-6 hours as needed for shortness of breath and wheezing. (Patient not taking: Reported on 11/27/2020) 8 g 1    enoxaparin (LOVENOX) 150 MG/ML injection Inject 150 mg into the skin daily.      metoCLOPramide (REGLAN) 10 MG tablet Take 1 tablet (10 mg total) by mouth every 6 (six) hours as needed for nausea. (Patient not taking: Reported on 11/27/2020) 30 tablet 0    ondansetron (ZOFRAN-ODT) 8 MG disintegrating tablet Take 1 tablet (8 mg total) by mouth every 8 (eight) hours as needed for nausea. (Patient not taking: Reported on 11/27/2020) 30 tablet 0    promethazine-dextromethorphan (PROMETHAZINE-DM) 6.25-15 MG/5ML syrup Take 5 mLs by mouth 4 (four) times daily as needed for cough. (Patient not taking: Reported on 11/27/2020) 140 mL 0    No current facility-administered medications for this visit.   Allergies  Allergen Reactions   Pregabalin Shortness Of Breath   Tramadol Nausea Only    Social History   Tobacco Use   Smoking status: Former    Types: Cigarettes    Quit date: 08/09/2003    Years since quitting: 17.3   Smokeless tobacco: Never  Substance Use Topics   Alcohol use: Yes    Comment: rare    Family History  Problem Relation Age of Onset   Rheum arthritis Mother    Cancer Father      Review of Systems  Musculoskeletal:  Positive for arthralgias.  All other systems reviewed and are negative.  Objective:  Physical Exam HENT:     Head: Normocephalic.  Eyes:     Pupils: Pupils are equal, round, and reactive to light.  Cardiovascular:     Rate and Rhythm: Normal rate.  Pulmonary:     Effort: Pulmonary effort is normal.  Abdominal:     Palpations: Abdomen is soft.  Genitourinary:    Comments: Deferred Musculoskeletal:        General: Tenderness present.     Cervical back: Normal range of motion.  Skin:    General: Skin is warm.  Neurological:     Mental Status: She is  alert and oriented to person, place, and time.  Psychiatric:        Behavior: Behavior normal.    Vital signs in last 24 hours: @VSRANGES @  Labs:   Estimated body mass index is 39.33 kg/m as calculated from the following:   Height as of 11/30/20: 5' 5.5" (1.664 m).   Weight as of 01/18/20: 108.9 kg.   Imaging Review Plain radiographs demonstrate severe degenerative joint disease of the right knee(s). The bone quality appears to be adequate for age and reported activity level.      Assessment/Plan:  End stage arthritis, right knee   The patient history, physical examination, clinical judgment of the provider and imaging studies are consistent with end stage degenerative joint disease of the right knee(s) and total knee arthroplasty is deemed medically necessary. The treatment options including medical management, injection therapy arthroscopy and arthroplasty were discussed at length. The risks and benefits of total knee arthroplasty were presented and reviewed. The risks due to aseptic loosening, infection, stiffness, patella tracking problems, thromboembolic complications and other imponderables were discussed. The patient acknowledged the explanation, agreed to proceed with the plan and consent was signed. Patient is being admitted for inpatient treatment for surgery, pain control, PT, OT, prophylactic antibiotics, VTE prophylaxis, progressive ambulation and ADL's and discharge planning. The patient is planning to be discharged  home after overnight observation     Patient's anticipated LOS is less than 2 midnights, meeting these requirements: - Younger than 79 - Lives within 1 hour of care - Has a competent adult at home to recover with post-op recover - NO history of  - Chronic pain requiring opiods  - Diabetes  - Coronary Artery Disease  - Heart failure  - Heart attack  - Stroke  - DVT/VTE  - Cardiac arrhythmia  - Respiratory Failure/COPD  - Renal failure  -  Anemia  - Advanced Liver disease

## 2020-12-11 ENCOUNTER — Other Ambulatory Visit: Payer: Self-pay | Admitting: Orthopedic Surgery

## 2020-12-12 LAB — SARS CORONAVIRUS 2 (TAT 6-24 HRS): SARS Coronavirus 2: NEGATIVE

## 2020-12-12 NOTE — Anesthesia Preprocedure Evaluation (Addendum)
Anesthesia Evaluation  Patient identified by MRN, date of birth, ID band Patient awake    Reviewed: Allergy & Precautions, NPO status , Patient's Chart, lab work & pertinent test results  History of Anesthesia Complications Negative for: history of anesthetic complications  Airway Mallampati: II  TM Distance: >3 FB Neck ROM: Full    Dental no notable dental hx. (+) Dental Advisory Given   Pulmonary former smoker, PE   Pulmonary exam normal        Cardiovascular hypertension, + DVT  Normal cardiovascular exam  Hx PE   Neuro/Psych Anxiety negative neurological ROS     GI/Hepatic negative GI ROS, Neg liver ROS,   Endo/Other  negative endocrine ROS  Renal/GU negative Renal ROS     Musculoskeletal negative musculoskeletal ROS (+)   Abdominal   Peds  Hematology  (+) Blood dyscrasia, , Clotting disorder Now taking Lovenox prior to surgery, will d/c day of surgery.   Anesthesia Other Findings   Reproductive/Obstetrics                            Anesthesia Physical Anesthesia Plan  ASA: 3  Anesthesia Plan: Spinal   Post-op Pain Management:    Induction:   PONV Risk Score and Plan: 3 and Ondansetron, Propofol infusion and Midazolam  Airway Management Planned: Natural Airway  Additional Equipment:   Intra-op Plan:   Post-operative Plan:   Informed Consent: I have reviewed the patients History and Physical, chart, labs and discussed the procedure including the risks, benefits and alternatives for the proposed anesthesia with the patient or authorized representative who has indicated his/her understanding and acceptance.     Dental advisory given  Plan Discussed with: Anesthesiologist and CRNA  Anesthesia Plan Comments:        Anesthesia Quick Evaluation

## 2020-12-13 ENCOUNTER — Ambulatory Visit (HOSPITAL_COMMUNITY): Payer: BC Managed Care – PPO | Admitting: Anesthesiology

## 2020-12-13 ENCOUNTER — Encounter (HOSPITAL_COMMUNITY): Payer: Self-pay | Admitting: Orthopedic Surgery

## 2020-12-13 ENCOUNTER — Ambulatory Visit (HOSPITAL_COMMUNITY)
Admission: RE | Admit: 2020-12-13 | Discharge: 2020-12-15 | Disposition: A | Discharge status: Home / Self Care | Payer: BC Managed Care – PPO | Source: Ambulatory Visit | Attending: Orthopedic Surgery | Admitting: Orthopedic Surgery

## 2020-12-13 ENCOUNTER — Ambulatory Visit (HOSPITAL_COMMUNITY): Payer: BC Managed Care – PPO

## 2020-12-13 ENCOUNTER — Other Ambulatory Visit: Payer: Self-pay

## 2020-12-13 ENCOUNTER — Ambulatory Visit (HOSPITAL_COMMUNITY): Payer: BC Managed Care – PPO | Admitting: Physician Assistant

## 2020-12-13 ENCOUNTER — Encounter (HOSPITAL_COMMUNITY)
Admission: RE | Disposition: A | Discharge status: Home / Self Care | Payer: Self-pay | Source: Ambulatory Visit | Attending: Orthopedic Surgery

## 2020-12-13 DIAGNOSIS — Z86718 Personal history of other venous thrombosis and embolism: Secondary | ICD-10-CM | POA: Diagnosis not present

## 2020-12-13 DIAGNOSIS — D689 Coagulation defect, unspecified: Secondary | ICD-10-CM | POA: Insufficient documentation

## 2020-12-13 DIAGNOSIS — M1711 Unilateral primary osteoarthritis, right knee: Secondary | ICD-10-CM | POA: Diagnosis present

## 2020-12-13 DIAGNOSIS — Z87891 Personal history of nicotine dependence: Secondary | ICD-10-CM | POA: Insufficient documentation

## 2020-12-13 DIAGNOSIS — I10 Essential (primary) hypertension: Secondary | ICD-10-CM | POA: Diagnosis not present

## 2020-12-13 DIAGNOSIS — Z86711 Personal history of pulmonary embolism: Secondary | ICD-10-CM | POA: Diagnosis not present

## 2020-12-13 DIAGNOSIS — M179 Osteoarthritis of knee, unspecified: Secondary | ICD-10-CM | POA: Insufficient documentation

## 2020-12-13 HISTORY — PX: KNEE ARTHROPLASTY: SHX992

## 2020-12-13 LAB — ABO/RH: ABO/RH(D): A POS

## 2020-12-13 SURGERY — ARTHROPLASTY, KNEE, TOTAL, USING IMAGELESS COMPUTER-ASSISTED NAVIGATION
Anesthesia: Spinal | Site: Knee | Laterality: Right

## 2020-12-13 MED ORDER — METOCLOPRAMIDE HCL 5 MG PO TABS
5.0000 mg | ORAL_TABLET | Freq: Three times a day (TID) | ORAL | Status: DC | PRN
  Administered 2020-12-14: 10 mg via ORAL
  Filled 2020-12-13: qty 2

## 2020-12-13 MED ORDER — OXYCODONE HCL 5 MG PO TABS
5.0000 mg | ORAL_TABLET | ORAL | Status: DC | PRN
  Administered 2020-12-14 (×2): 10 mg via ORAL
  Filled 2020-12-13 (×2): qty 2

## 2020-12-13 MED ORDER — ACETAMINOPHEN 325 MG PO TABS
325.0000 mg | ORAL_TABLET | Freq: Four times a day (QID) | ORAL | Status: DC | PRN
  Administered 2020-12-15: 650 mg via ORAL
  Filled 2020-12-13: qty 2

## 2020-12-13 MED ORDER — ORAL CARE MOUTH RINSE: 15.0000 mL | Freq: Once | OROMUCOSAL | Status: AC

## 2020-12-13 MED ORDER — LACTATED RINGERS IV SOLN: INTRAVENOUS | Status: DC

## 2020-12-13 MED ORDER — DIPHENHYDRAMINE HCL 12.5 MG/5ML PO ELIX: 12.5000 mg | ORAL_SOLUTION | ORAL | Status: DC | PRN

## 2020-12-13 MED ORDER — BUPIVACAINE-EPINEPHRINE 0.25% -1:200000 IJ SOLN
INTRAMUSCULAR | Status: DC | PRN
  Administered 2020-12-13: 30 mL

## 2020-12-13 MED ORDER — STERILE WATER FOR IRRIGATION IR SOLN
Status: DC | PRN
  Administered 2020-12-13: 2000 mL

## 2020-12-13 MED ORDER — CEFAZOLIN SODIUM-DEXTROSE 2-4 GM/100ML-% IV SOLN
2.0000 g | INTRAVENOUS | Status: AC
  Administered 2020-12-13: 2 g via INTRAVENOUS
  Filled 2020-12-13: qty 100

## 2020-12-13 MED ORDER — HYDROMORPHONE HCL 1 MG/ML IJ SOLN
0.5000 mg | INTRAMUSCULAR | Status: DC | PRN
  Administered 2020-12-13 – 2020-12-14 (×4): 1 mg via INTRAVENOUS
  Filled 2020-12-13 (×3): qty 1

## 2020-12-13 MED ORDER — PROPOFOL 500 MG/50ML IV EMUL
INTRAVENOUS | Status: DC | PRN
  Administered 2020-12-13: 75 ug/kg/min via INTRAVENOUS

## 2020-12-13 MED ORDER — ALUM & MAG HYDROXIDE-SIMETH 200-200-20 MG/5ML PO SUSP: 30.0000 mL | ORAL | Status: DC | PRN

## 2020-12-13 MED ORDER — KETOROLAC TROMETHAMINE 30 MG/ML IJ SOLN
INTRAMUSCULAR | Status: AC
  Filled 2020-12-13: qty 1

## 2020-12-13 MED ORDER — ALBUTEROL SULFATE (2.5 MG/3ML) 0.083% IN NEBU: 2.5000 mg | INHALATION_SOLUTION | RESPIRATORY_TRACT | Status: DC | PRN

## 2020-12-13 MED ORDER — DEXAMETHASONE SODIUM PHOSPHATE 10 MG/ML IJ SOLN
INTRAMUSCULAR | Status: DC | PRN
  Administered 2020-12-13: 5 mg

## 2020-12-13 MED ORDER — 0.9 % SODIUM CHLORIDE (POUR BTL) OPTIME
TOPICAL | Status: DC | PRN
  Administered 2020-12-13: 1000 mL

## 2020-12-13 MED ORDER — CEFAZOLIN SODIUM-DEXTROSE 2-4 GM/100ML-% IV SOLN
2.0000 g | Freq: Four times a day (QID) | INTRAVENOUS | Status: AC
  Administered 2020-12-13 (×2): 2 g via INTRAVENOUS
  Filled 2020-12-13 (×2): qty 100

## 2020-12-13 MED ORDER — PHENYLEPHRINE 40 MCG/ML (10ML) SYRINGE FOR IV PUSH (FOR BLOOD PRESSURE SUPPORT)
PREFILLED_SYRINGE | INTRAVENOUS | Status: DC | PRN
  Administered 2020-12-13: 40 ug via INTRAVENOUS

## 2020-12-13 MED ORDER — ALBUTEROL SULFATE HFA 108 (90 BASE) MCG/ACT IN AERS: 2.0000 | INHALATION_SPRAY | RESPIRATORY_TRACT | Status: DC | PRN

## 2020-12-13 MED ORDER — DOCUSATE SODIUM 100 MG PO CAPS
100.0000 mg | ORAL_CAPSULE | Freq: Two times a day (BID) | ORAL | Status: DC
  Administered 2020-12-13 – 2020-12-15 (×4): 100 mg via ORAL
  Filled 2020-12-13 (×4): qty 1

## 2020-12-13 MED ORDER — SODIUM CHLORIDE (PF) 0.9 % IJ SOLN
INTRAMUSCULAR | Status: AC
  Filled 2020-12-13: qty 30

## 2020-12-13 MED ORDER — SODIUM CHLORIDE 0.9 % IV SOLN: INTRAVENOUS | Status: DC

## 2020-12-13 MED ORDER — ROPIVACAINE HCL 5 MG/ML IJ SOLN
INTRAMUSCULAR | Status: DC | PRN
  Administered 2020-12-13: 20 mL via PERINEURAL

## 2020-12-13 MED ORDER — POVIDONE-IODINE 10 % EX SWAB: 2.0000 "application " | Freq: Once | CUTANEOUS | Status: DC

## 2020-12-13 MED ORDER — MIDAZOLAM HCL 2 MG/2ML IJ SOLN
1.0000 mg | Freq: Once | INTRAMUSCULAR | Status: AC
  Administered 2020-12-13: 2 mg via INTRAVENOUS
  Filled 2020-12-13: qty 2

## 2020-12-13 MED ORDER — METHOCARBAMOL 500 MG IVPB - SIMPLE MED
INTRAVENOUS | Status: AC
  Filled 2020-12-13: qty 50

## 2020-12-13 MED ORDER — POLYETHYLENE GLYCOL 3350 17 G PO PACK: 17.0000 g | PACK | Freq: Every day | ORAL | Status: DC | PRN

## 2020-12-13 MED ORDER — METHOCARBAMOL 500 MG IVPB - SIMPLE MED
500.0000 mg | Freq: Four times a day (QID) | INTRAVENOUS | Status: DC | PRN
  Administered 2020-12-13: 500 mg via INTRAVENOUS
  Filled 2020-12-13: qty 50

## 2020-12-13 MED ORDER — SODIUM CHLORIDE (PF) 0.9 % IJ SOLN
INTRAMUSCULAR | Status: DC | PRN
  Administered 2020-12-13: 30 mL

## 2020-12-13 MED ORDER — OXYCODONE HCL 5 MG PO TABS
ORAL_TABLET | ORAL | Status: AC
  Filled 2020-12-13: qty 2

## 2020-12-13 MED ORDER — BUPIVACAINE IN DEXTROSE 0.75-8.25 % IT SOLN
INTRATHECAL | Status: DC | PRN
  Administered 2020-12-13: 1.8 mL via INTRATHECAL

## 2020-12-13 MED ORDER — APIXABAN 2.5 MG PO TABS
2.5000 mg | ORAL_TABLET | Freq: Two times a day (BID) | ORAL | Status: DC
  Administered 2020-12-14 – 2020-12-15 (×3): 2.5 mg via ORAL
  Filled 2020-12-13 (×3): qty 1

## 2020-12-13 MED ORDER — MENTHOL 3 MG MT LOZG: 1.0000 | LOZENGE | OROMUCOSAL | Status: DC | PRN

## 2020-12-13 MED ORDER — BUPIVACAINE-EPINEPHRINE (PF) 0.25% -1:200000 IJ SOLN
INTRAMUSCULAR | Status: AC
  Filled 2020-12-13: qty 30

## 2020-12-13 MED ORDER — PHENOL 1.4 % MT LIQD: 1.0000 | OROMUCOSAL | Status: DC | PRN

## 2020-12-13 MED ORDER — TRANEXAMIC ACID-NACL 1000-0.7 MG/100ML-% IV SOLN
1000.0000 mg | INTRAVENOUS | Status: AC
  Administered 2020-12-13: 1000 mg via INTRAVENOUS
  Filled 2020-12-13: qty 100

## 2020-12-13 MED ORDER — CHLORHEXIDINE GLUCONATE 0.12 % MT SOLN
15.0000 mL | Freq: Once | OROMUCOSAL | Status: AC
  Administered 2020-12-13: 15 mL via OROMUCOSAL

## 2020-12-13 MED ORDER — ACETAMINOPHEN 10 MG/ML IV SOLN
1000.0000 mg | Freq: Once | INTRAVENOUS | Status: AC
  Administered 2020-12-13: 1000 mg via INTRAVENOUS
  Filled 2020-12-13: qty 100

## 2020-12-13 MED ORDER — FENTANYL CITRATE PF 50 MCG/ML IJ SOSY
50.0000 ug | PREFILLED_SYRINGE | Freq: Once | INTRAMUSCULAR | Status: AC
  Administered 2020-12-13: 100 ug via INTRAVENOUS
  Filled 2020-12-13: qty 2

## 2020-12-13 MED ORDER — OXYCODONE HCL 5 MG PO TABS
10.0000 mg | ORAL_TABLET | ORAL | Status: DC | PRN
  Administered 2020-12-13: 15 mg via ORAL
  Administered 2020-12-13: 10 mg via ORAL
  Administered 2020-12-14 – 2020-12-15 (×4): 15 mg via ORAL
  Filled 2020-12-13 (×5): qty 3

## 2020-12-13 MED ORDER — HYDROMORPHONE HCL 1 MG/ML IJ SOLN
INTRAMUSCULAR | Status: AC
  Filled 2020-12-13: qty 2

## 2020-12-13 MED ORDER — ONDANSETRON HCL 4 MG PO TABS: 4.0000 mg | ORAL_TABLET | Freq: Four times a day (QID) | ORAL | Status: DC | PRN

## 2020-12-13 MED ORDER — CELECOXIB 200 MG PO CAPS
200.0000 mg | ORAL_CAPSULE | Freq: Two times a day (BID) | ORAL | Status: DC
  Administered 2020-12-13 – 2020-12-15 (×4): 200 mg via ORAL
  Filled 2020-12-13 (×4): qty 1

## 2020-12-13 MED ORDER — METHOCARBAMOL 500 MG PO TABS
500.0000 mg | ORAL_TABLET | Freq: Four times a day (QID) | ORAL | Status: DC | PRN
  Administered 2020-12-13 – 2020-12-15 (×5): 500 mg via ORAL
  Filled 2020-12-13 (×5): qty 1

## 2020-12-13 MED ORDER — POVIDONE-IODINE 10 % EX SWAB
2.0000 "application " | Freq: Once | CUTANEOUS | Status: AC
  Administered 2020-12-13: 2 via TOPICAL

## 2020-12-13 MED ORDER — METOCLOPRAMIDE HCL 5 MG/ML IJ SOLN: 5.0000 mg | Freq: Three times a day (TID) | INTRAMUSCULAR | Status: DC | PRN

## 2020-12-13 MED ORDER — KETOROLAC TROMETHAMINE 30 MG/ML IJ SOLN
INTRAMUSCULAR | Status: DC | PRN
  Administered 2020-12-13: 30 mg via INTRAMUSCULAR

## 2020-12-13 MED ORDER — ISOPROPYL ALCOHOL 70 % SOLN
Status: DC | PRN
  Administered 2020-12-13: 1 via TOPICAL

## 2020-12-13 MED ORDER — DEXAMETHASONE SODIUM PHOSPHATE 10 MG/ML IJ SOLN
10.0000 mg | Freq: Once | INTRAMUSCULAR | Status: AC
  Administered 2020-12-14: 10 mg via INTRAVENOUS
  Filled 2020-12-13: qty 1

## 2020-12-13 MED ORDER — SODIUM CHLORIDE 0.9 % IR SOLN
Status: DC | PRN
  Administered 2020-12-13: 2000 mL

## 2020-12-13 MED ORDER — PHENYLEPHRINE 40 MCG/ML (10ML) SYRINGE FOR IV PUSH (FOR BLOOD PRESSURE SUPPORT)
PREFILLED_SYRINGE | INTRAVENOUS | Status: AC
  Filled 2020-12-13: qty 10

## 2020-12-13 MED ORDER — ONDANSETRON HCL 4 MG/2ML IJ SOLN
4.0000 mg | Freq: Four times a day (QID) | INTRAMUSCULAR | Status: DC | PRN
  Administered 2020-12-13 – 2020-12-14 (×3): 4 mg via INTRAVENOUS
  Filled 2020-12-13 (×3): qty 2

## 2020-12-13 SURGICAL SUPPLY — 69 items
BAG COUNTER SPONGE SURGICOUNT (BAG) IMPLANT
BAG ZIPLOCK 12X15 (MISCELLANEOUS) IMPLANT
BATTERY INSTRU NAVIGATION (MISCELLANEOUS) ×6 IMPLANT
BLADE SAW RECIPROCATING 77.5 (BLADE) ×2 IMPLANT
BNDG COHESIVE 4X5 TAN ST LF (GAUZE/BANDAGES/DRESSINGS) ×2 IMPLANT
BNDG ELASTIC 3X5.8 VLCR STR LF (GAUZE/BANDAGES/DRESSINGS) ×2 IMPLANT
BNDG ELASTIC 4X5.8 VLCR STR LF (GAUZE/BANDAGES/DRESSINGS) ×2 IMPLANT
BNDG ELASTIC 6X10 VLCR STRL LF (GAUZE/BANDAGES/DRESSINGS) ×2 IMPLANT
BNDG ELASTIC 6X5.8 VLCR STR LF (GAUZE/BANDAGES/DRESSINGS) ×2 IMPLANT
CHLORAPREP W/TINT 26 (MISCELLANEOUS) ×4 IMPLANT
COMPONENT TRI CR RETAIN SZ6 RT (Miscellaneous) ×1 IMPLANT
COVER SURGICAL LIGHT HANDLE (MISCELLANEOUS) ×2 IMPLANT
DECANTER SPIKE VIAL GLASS SM (MISCELLANEOUS) ×4 IMPLANT
DERMABOND ADVANCED (GAUZE/BANDAGES/DRESSINGS) ×1
DERMABOND ADVANCED .7 DNX12 (GAUZE/BANDAGES/DRESSINGS) ×1 IMPLANT
DRAPE INCISE IOBAN 66X45 STRL (DRAPES) ×6 IMPLANT
DRAPE SHEET LG 3/4 BI-LAMINATE (DRAPES) ×6 IMPLANT
DRAPE U-SHAPE 47X51 STRL (DRAPES) ×2 IMPLANT
DRESSING AQUACEL AG SP 3.5X10 (GAUZE/BANDAGES/DRESSINGS) ×1 IMPLANT
DRSG AQUACEL AG ADV 3.5X14 (GAUZE/BANDAGES/DRESSINGS) ×2 IMPLANT
DRSG AQUACEL AG SP 3.5X10 (GAUZE/BANDAGES/DRESSINGS) ×2
ELECT BLADE TIP CTD 4 INCH (ELECTRODE) ×2 IMPLANT
ELECT REM PT RETURN 15FT ADLT (MISCELLANEOUS) ×2 IMPLANT
GAUZE SPONGE 4X4 12PLY STRL (GAUZE/BANDAGES/DRESSINGS) ×2 IMPLANT
GLOVE SRG 8 PF TXTR STRL LF DI (GLOVE) ×2 IMPLANT
GLOVE SURG ENC MOIS LTX SZ8.5 (GLOVE) ×4 IMPLANT
GLOVE SURG ENC TEXT LTX SZ7.5 (GLOVE) ×6 IMPLANT
GLOVE SURG UNDER POLY LF SZ8 (GLOVE) ×4
GLOVE SURG UNDER POLY LF SZ8.5 (GLOVE) ×2 IMPLANT
GOWN SPEC L3 XXLG W/TWL (GOWN DISPOSABLE) ×2 IMPLANT
GOWN STRL REUS W/TWL XL LVL3 (GOWN DISPOSABLE) ×2 IMPLANT
HANDPIECE INTERPULSE COAX TIP (DISPOSABLE) ×2
HOLDER FOLEY CATH W/STRAP (MISCELLANEOUS) ×2 IMPLANT
HOOD PEEL AWAY FLYTE STAYCOOL (MISCELLANEOUS) ×6 IMPLANT
INSERT TIB BEAR TRIATH 5X12 (Insert) ×2 IMPLANT
JET LAVAGE IRRISEPT WOUND (IRRIGATION / IRRIGATOR)
KIT TURNOVER KIT A (KITS) IMPLANT
KNEE PATELLA ASYMMETRIC 10X35 (Knees) ×2 IMPLANT
KNEE TIBIAL COMPONENT SZ5 (Knees) ×2 IMPLANT
LAVAGE JET IRRISEPT WOUND (IRRIGATION / IRRIGATOR) IMPLANT
MARKER SKIN DUAL TIP RULER LAB (MISCELLANEOUS) ×2 IMPLANT
NDL SAFETY ECLIPSE 18X1.5 (NEEDLE) ×1 IMPLANT
NEEDLE HYPO 18GX1.5 SHARP (NEEDLE) ×2
NEEDLE SPNL 18GX3.5 QUINCKE PK (NEEDLE) ×2 IMPLANT
NS IRRIG 1000ML POUR BTL (IV SOLUTION) ×2 IMPLANT
PACK TOTAL KNEE CUSTOM (KITS) ×2 IMPLANT
PADDING CAST ABS 6INX4YD NS (CAST SUPPLIES) ×1
PADDING CAST ABS COTTON 6X4 NS (CAST SUPPLIES) ×1 IMPLANT
PADDING CAST COTTON 6X4 STRL (CAST SUPPLIES) ×2 IMPLANT
PIN FLUTED HEDLESS FIX 3.5X1/8 (PIN) ×2 IMPLANT
PROTECTOR NERVE ULNAR (MISCELLANEOUS) ×2 IMPLANT
SAW OSC TIP CART 19.5X105X1.3 (SAW) ×2 IMPLANT
SEALER BIPOLAR AQUA 6.0 (INSTRUMENTS) ×2 IMPLANT
SET HNDPC FAN SPRY TIP SCT (DISPOSABLE) ×1 IMPLANT
SET PAD KNEE POSITIONER (MISCELLANEOUS) ×2 IMPLANT
SUT MNCRL AB 3-0 PS2 18 (SUTURE) ×2 IMPLANT
SUT MNCRL AB 4-0 PS2 18 (SUTURE) ×2 IMPLANT
SUT MON AB 2-0 CT1 36 (SUTURE) ×2 IMPLANT
SUT STRATAFIX PDO 1 14 VIOLET (SUTURE) ×2
SUT STRATFX PDO 1 14 VIOLET (SUTURE) ×1
SUT VIC AB 1 CTX 36 (SUTURE) ×4
SUT VIC AB 1 CTX36XBRD ANBCTR (SUTURE) ×2 IMPLANT
SUT VIC AB 2-0 CT1 27 (SUTURE) ×2
SUT VIC AB 2-0 CT1 TAPERPNT 27 (SUTURE) ×1 IMPLANT
SUTURE STRATFX PDO 1 14 VIOLET (SUTURE) ×1 IMPLANT
TRAY FOLEY MTR SLVR 16FR STAT (SET/KITS/TRAYS/PACK) IMPLANT
TRIATHLON CRUCIATE RETAIN SZ6 (Miscellaneous) ×2 IMPLANT
TUBE SUCTION HIGH CAP CLEAR NV (SUCTIONS) ×2 IMPLANT
WATER STERILE IRR 1000ML POUR (IV SOLUTION) ×4 IMPLANT

## 2020-12-13 NOTE — Transfer of Care (Signed)
Immediate Anesthesia Transfer of Care Note  Patient: [name]  Procedure(s) Performed: COMPUTER ASSISTED TOTAL KNEE ARTHROPLASTY (Right: Knee)  Patient Location: PACU  Anesthesia Type:Spinal and MAC combined with regional for post-op pain  Level of Consciousness: awake, alert , oriented and patient cooperative  Airway & Oxygen Therapy: Patient Spontanous Breathing and Patient connected to face mask oxygen  Post-op Assessment: Report given to RN and Post -op Vital signs reviewed and stable  Post vital signs: Reviewed and stable  Last Vitals:  Vitals Value Taken Time  BP 138/123 12/13/20 1326  Temp    Pulse 81 12/13/20 1327  Resp 20 12/13/20 1327  SpO2 100 % 12/13/20 1327  Vitals shown include unvalidated device data.  Last Pain:  Vitals:   12/13/20 0945  TempSrc:   PainSc: 0-No pain         Complications: No notable events documented.

## 2020-12-13 NOTE — Anesthesia Procedure Notes (Signed)
Spinal  Start time: 12/13/2020 10:36 AM End time: 12/13/2020 10:43 AM Reason for block: surgical anesthesia Staffing Performed: resident/CRNA  Resident/CRNA: Myna Bright, CRNA Preanesthetic Checklist Completed: patient identified, IV checked, site marked, risks and benefits discussed, surgical consent, monitors and equipment checked, pre-op evaluation and timeout performed Spinal Block Patient position: sitting Prep: DuraPrep Patient monitoring: continuous pulse ox, cardiac monitor, blood pressure and heart rate Approach: midline Location: L3-4 Injection technique: single-shot Needle Needle type: Pencan  Needle gauge: 25 G Needle length: 10 cm Needle insertion depth: 9 cm Assessment Sensory level: T6 Events: CSF return

## 2020-12-13 NOTE — Anesthesia Postprocedure Evaluation (Signed)
Anesthesia Post Note  Patient: GRACEYN FODOR  Procedure(s) Performed: COMPUTER ASSISTED TOTAL KNEE ARTHROPLASTY (Right: Knee)     Patient location during evaluation: PACU Anesthesia Type: Spinal Level of consciousness: awake and alert Pain management: pain level controlled Vital Signs Assessment: post-procedure vital signs reviewed and stable Respiratory status: spontaneous breathing and respiratory function stable Cardiovascular status: blood pressure returned to baseline and stable Postop Assessment: spinal receding Anesthetic complications: no   No notable events documented.  Last Vitals:  Vitals:   12/13/20 1430 12/13/20 1530  BP: (!) 142/80 138/74  Pulse: 71 63  Resp: 14 17  Temp: 36.7 C   SpO2: 96% 100%    Last Pain:  Vitals:   12/13/20 1530  TempSrc:   PainSc: 0-No pain    LLE Motor Response: Purposeful movement (12/13/20 1530) LLE Sensation: Decreased (12/13/20 1530) RLE Motor Response: Purposeful movement (12/13/20 1530) RLE Sensation: Decreased (12/13/20 1530) L Sensory Level: L5-Outer lower leg, top of foot, great toe (12/13/20 1530) R Sensory Level: L5-Outer lower leg, top of foot, great toe (12/13/20 1530)  Yaretzi Ernandez DANIEL

## 2020-12-13 NOTE — Plan of Care (Signed)
  Problem: Clinical Measurements: Goal: Ability to maintain clinical measurements within normal limits will improve Outcome: Progressing   Problem: Activity: Goal: Risk for activity intolerance will decrease Outcome: Progressing   Problem: Pain Managment: Goal: General experience of comfort will improve Outcome: Progressing   

## 2020-12-13 NOTE — Op Note (Signed)
OPERATIVE REPORT  SURGEON: Rod Can, MD   ASSISTANT: Cherlynn June, PA-C  PREOPERATIVE DIAGNOSIS: Right knee arthritis.   POSTOPERATIVE DIAGNOSIS: Right knee arthritis.   PROCEDURE: Right total knee arthroplasty.   IMPLANTS: Stryker Triathlon CR femur, size 6. Stryker Tritanium tibia, size 5. X3 polyethelyene insert, size 12 mm, CS. 3 button asymmetric patella, size 35 mm.  ANESTHESIA:  MAC, Regional, and Spinal  TOURNIQUET TIME: Not utilized.   ESTIMATED BLOOD LOSS:-100 mL    ANTIBIOTICS: 2 g Ancef.  DRAINS: None.  COMPLICATIONS: None   CONDITION: PACU - hemodynamically stable.   BRIEF CLINICAL NOTE: [name] is a 60 y.o. female with a long-standing history of Right knee arthritis. After failing conservative management, the patient was indicated for total knee arthroplasty. The risks, benefits, and alternatives to the procedure were explained, and the patient elected to proceed.  PROCEDURE IN DETAIL: Adductor canal block was obtained in the pre-op holding area. Once inside the operative room, spinal anesthesia was obtained, and a foley catheter was inserted. The patient was then positioned, a nonsterile tourniquet was placed, and the lower extremity was prepped and draped in the normal sterile surgical fashion.  A time-out was called verifying side and site of surgery. The patient received IV antibiotics within 60 minutes of beginning the procedure. The tourniquet was not utilized.   An anterior approach to the knee was performed utilizing a midvastus arthrotomy. A medial release was performed and the patellar fat pad was excised. Stryker imageless navigation was used to cut the distal femur perpendicular to the mechanical axis. A freehand patellar resection was performed, and the patella was sized and prepared with 3 lug holes.  Nagivation was used to make a  neutral proximal tibia resection, taking 6 mm of bone from the less affected lateral side with 3 degrees of slope. The menisci were excised. A spacer block was placed, and the alignment and balance in extension were confirmed.   The distal femur was sized using the 3-degree external rotation guide referencing the posterior femoral cortex. The appropriate 4-in-1 cutting block was pinned into place. Rotation was checked using Whiteside's line, the epicondylar axis, and then confirmed with a spacer block in flexion. The remaining femoral cuts were performed, taking care to protect the MCL.  The tibia was sized and the trial tray was pinned into place. The remaining trail components were inserted. The knee was stable to varus and valgus stress through a full range of motion. The patella tracked centrally, and the PCL was well balanced. The trial components were removed, and the proximal tibial surface was prepared. Final components were impacted into place. The knee was tested for a final time and found to be well balanced.   The wound was copiously irrigated with Irrisept solution and normal saline using pule lavage.  Marcaine solution was injected into the periarticular soft tissue.  The wound was closed in layers using #1 Vicryl and Stratafix for the fascia, 2-0 Vicryl for the subcutaneous fat, 2-0 Monocryl for the deep dermal layer, and skin staples. Dermabond was applied to the skin.  Once the glue was fully dried, an Aquacell Ag and compressive dressing were applied.  Tthe patient was transported to the recovery room in stable condition.  Sponge, needle, and instrument counts were correct at the end of the case x2.  The patient tolerated the procedure well and there were no known complications.  Please note that a surgical assistant was a medical necessity for this procedure in order  to perform it in a safe and expeditious manner. Surgical assistant was necessary to retract the ligaments and vital  neurovascular structures to prevent injury to them and also necessary for proper positioning of the limb to allow for anatomic placement of the prosthesis.

## 2020-12-13 NOTE — Progress Notes (Signed)
Assisted Dr. Singer with right, ultrasound guided, adductor canal block. Side rails up, monitors on throughout procedure. See vital signs in flow sheet. Tolerated Procedure well.  

## 2020-12-13 NOTE — Anesthesia Procedure Notes (Signed)
Anesthesia Regional Block: Adductor canal block   Pre-Anesthetic Checklist: , timeout performed,  Correct Patient, Correct Site, Correct Laterality,  Correct Procedure, Correct Position, site marked,  Risks and benefits discussed,  Surgical consent,  Pre-op evaluation,  At surgeon's request and post-op pain management  Laterality: Right  Prep: chloraprep       Needles:  Injection technique: Single-shot  Needle Type: Stimulator Needle - 80     Needle Length: 10cm  Needle Gauge: 21     Additional Needles:   Narrative:  Start time: 12/13/2020 9:39 AM End time: 12/13/2020 9:49 AM Injection made incrementally with aspirations every 5 mL.  Performed by: Personally  Anesthesiologist: Duane Boston, MD

## 2020-12-13 NOTE — Discharge Instructions (Signed)
 Dr. Tamana Hatfield Total Joint Specialist [facility] [address] , [place] [address] [phone]  TOTAL KNEE REPLACEMENT POSTOPERATIVE DIRECTIONS    Knee Rehabilitation, Guidelines Following Surgery  Results after knee surgery are often greatly improved when you follow the exercise, range of motion and muscle strengthening exercises prescribed by your doctor. Safety measures are also important to protect the knee from further injury. Any time any of these exercises cause you to have increased pain or swelling in your knee joint, decrease the amount until you are comfortable again and slowly increase them. If you have problems or questions, call your caregiver or physical therapist for advice.   WEIGHT BEARING Weight bearing as tolerated with assist device (walker, cane, etc) as directed, use it as long as suggested by your surgeon or therapist, typically at least 4-6 weeks.  HOME CARE INSTRUCTIONS  Remove items at home which could result in a fall. This includes throw rugs or furniture in walking pathways.  Continue medications as instructed at time of discharge. You may have some home medications which will be placed on hold until you complete the course of blood thinner medication.  You may start showering once you are discharged home but do not submerge the incision under water. Just pat the incision dry and apply a dry gauze dressing on daily. Walk with walker as instructed.  You may resume a sexual relationship in one month or when given the OK by your doctor.  Use walker as long as suggested by your caregivers. Avoid periods of inactivity such as sitting longer than an hour when not asleep. This helps prevent blood clots.  You may put full weight on your legs and walk as much as is comfortable.  You may return to work once you are cleared by your doctor.  Do not drive a car for 6 weeks or until released by you surgeon.  Do not drive while  taking narcotics.  Wear the elastic stockings for three weeks following surgery during the day but you may remove then at night. Make sure you keep all of your appointments after your operation with all of your doctors and caregivers. You should call the office at the above phone number and make an appointment for approximately two weeks after the date of your surgery. Do not remove your surgical dressing. The dressing is waterproof; you may take showers in 3 days, but do not take tub baths or submerge the dressing. Please pick up a stool softener and laxative for home use as long as you are requiring pain medications. ICE to the affected knee every three hours for 30 minutes at a time and then as needed for pain and swelling.  Continue to use ice on the knee for pain and swelling from surgery. You may notice swelling that will progress down to the foot and ankle.  This is normal after surgery.  Elevate the leg when you are not up walking on it.   It is important for you to complete the blood thinner medication as prescribed by your doctor. Continue to use the breathing machine which will help keep your temperature down.  It is common for your temperature to cycle up and down following surgery, especially at night when you are not up moving around and exerting yourself.  The breathing machine keeps your lungs expanded and your temperature down.  RANGE OF MOTION AND STRENGTHENING EXERCISES  Rehabilitation of the knee is important following a knee injury or an   operation. After just a few days of immobilization, the muscles of the thigh which control the knee become weakened and shrink (atrophy). Knee exercises are designed to build up the tone and strength of the thigh muscles and to improve knee motion. Often times heat used for twenty to thirty minutes before working out will loosen up your tissues and help with improving the range of motion but do not use heat for the first two weeks following surgery.  These exercises can be done on a training (exercise) mat, on the floor, on a table or on a bed. Use what ever works the best and is most comfortable for you Knee exercises include:  Leg Lifts - While your knee is still immobilized in a splint or cast, you can do straight leg raises. Lift the leg to 60 degrees, hold for 3 sec, and slowly lower the leg. Repeat 10-20 times 2-3 times daily. Perform this exercise against resistance later as your knee gets better.  Quad and Hamstring Sets - Tighten up the muscle on the front of the thigh (Quad) and hold for 5-10 sec. Repeat this 10-20 times hourly. Hamstring sets are done by pushing the foot backward against an object and holding for 5-10 sec. Repeat as with quad sets.  A rehabilitation program following serious knee injuries can speed recovery and prevent re-injury in the future due to weakened muscles. Contact your doctor or a physical therapist for more information on knee rehabilitation.   POST-OPERATIVE OPIOID TAPER INSTRUCTIONS: It is important to wean off of your opioid medication as soon as possible. If you do not need pain medication after your surgery it is ok to stop day one. Opioids include: Codeine, Hydrocodone(Norco, Vicodin), Oxycodone(Percocet, oxycontin) and hydromorphone amongst others.  Long term and even short term use of opiods can cause: Increased pain response Dependence Constipation Depression Respiratory depression And more.  Withdrawal symptoms can include Flu like symptoms Nausea, vomiting And more Techniques to manage these symptoms Hydrate well Eat regular healthy meals Stay active Use relaxation techniques(deep breathing, meditating, yoga) Do Not substitute Alcohol to help with tapering If you have been on opioids for less than two weeks and do not have pain than it is ok to stop all together.  Plan to wean off of opioids This plan should start within one week post op of your joint replacement. Maintain the same  interval or time between taking each dose and first decrease the dose.  Cut the total daily intake of opioids by one tablet each day Next start to increase the time between doses. The last dose that should be eliminated is the evening dose.    SKILLED REHAB INSTRUCTIONS: If the patient is transferred to a skilled rehab facility following release from the hospital, a list of the current medications will be sent to the facility for the patient to continue.  When discharged from the skilled rehab facility, please have the facility set up the patient's Home Health Physical Therapy prior to being released. Also, the skilled facility will be responsible for providing the patient with their medications at time of release from the facility to include their pain medication, the muscle relaxants, and their blood thinner medication. If the patient is still at the rehab facility at time of the two week follow up appointment, the skilled rehab facility will also need to assist the patient in arranging follow up appointment in our office and any transportation needs.  MAKE SURE YOU:  Understand these instructions.  Will watch   your condition.  Will get help right away if you are not doing well or get worse.    Pick up stool softner and laxative for home use following surgery while on pain medications. Do NOT remove your dressing. You may shower.  Do not take tub baths or submerge incision under water. May shower starting three days after surgery. Please use a clean towel to pat the incision dry following showers. Continue to use ice for pain and swelling after surgery. Do not use any lotions or creams on the incision until instructed by your surgeon.  

## 2020-12-13 NOTE — Interval H&P Note (Signed)
History and Physical Interval Note:  12/13/2020 9:50 AM  [name]  has presented today for surgery, with the diagnosis of Right knee osteoarthritis.  The various methods of treatment have been discussed with the patient and family. After consideration of risks, benefits and other options for treatment, the patient has consented to  Procedure(s): COMPUTER ASSISTED TOTAL KNEE ARTHROPLASTY (Right) as a surgical intervention.  The patient's history has been reviewed, patient examined, no change in status, stable for surgery.  I have reviewed the patient's chart and labs.  Questions were answered to the patient's satisfaction.     Hilton Cork Dallas Torok

## 2020-12-14 DIAGNOSIS — M179 Osteoarthritis of knee, unspecified: Secondary | ICD-10-CM | POA: Diagnosis not present

## 2020-12-14 LAB — BASIC METABOLIC PANEL
Anion gap: 4 — ABNORMAL LOW (ref 5–15)
BUN: 21 mg/dL — ABNORMAL HIGH (ref 6–20)
CO2: 25 mmol/L (ref 22–32)
Calcium: 8.4 mg/dL — ABNORMAL LOW (ref 8.9–10.3)
Chloride: 106 mmol/L (ref 98–111)
Creatinine, Ser: 0.94 mg/dL (ref 0.44–1.00)
GFR, Estimated: >60 mL/min (ref >60)
Glucose, Bld: 109 mg/dL — ABNORMAL HIGH (ref 70–99)
Potassium: 4.5 mmol/L (ref 3.5–5.1)
Sodium: 135 mmol/L (ref 135–145)

## 2020-12-14 LAB — CBC
HCT: 31.8 % — ABNORMAL LOW (ref 36.0–46.0)
Hemoglobin: 10.2 g/dL — ABNORMAL LOW (ref 12.0–15.0)
MCH: 30.1 pg (ref 26.0–34.0)
MCHC: 32.1 g/dL (ref 30.0–36.0)
MCV: 93.8 fL (ref 80.0–100.0)
Platelets: 225 10*3/uL (ref 150–400)
RBC: 3.39 MIL/uL — ABNORMAL LOW (ref 3.87–5.11)
RDW: 14.2 % (ref 11.5–15.5)
WBC: 6.4 10*3/uL (ref 4.0–10.5)
nRBC: 0 % (ref 0.0–0.2)

## 2020-12-14 MED ORDER — ONDANSETRON HCL 4 MG PO TABS: 4.0000 mg | ORAL_TABLET | Freq: Four times a day (QID) | ORAL | 0 refills | Status: DC | PRN

## 2020-12-14 MED ORDER — OXYCODONE HCL ER 10 MG PO T12A: EXTENDED_RELEASE_TABLET | ORAL | 0 refills | Status: AC

## 2020-12-14 MED ORDER — OXYCODONE HCL 5 MG PO TABS: 5.0000 mg | ORAL_TABLET | ORAL | 0 refills | Status: DC | PRN

## 2020-12-14 MED ORDER — DOCUSATE SODIUM 100 MG PO CAPS: 100.0000 mg | ORAL_CAPSULE | Freq: Two times a day (BID) | ORAL | 0 refills | Status: DC

## 2020-12-14 MED ORDER — APIXABAN 5 MG PO TABS
5.0000 mg | ORAL_TABLET | Freq: Two times a day (BID) | ORAL | 0 refills | Status: DC
Start: 2020-12-14 — End: 2023-01-26

## 2020-12-14 MED ORDER — APIXABAN 2.5 MG PO TABS: 2.5000 mg | ORAL_TABLET | Freq: Two times a day (BID) | ORAL | 0 refills | Status: DC

## 2020-12-14 MED ORDER — ACETAMINOPHEN 325 MG PO TABS: 325.0000 mg | ORAL_TABLET | Freq: Four times a day (QID) | ORAL | Status: DC | PRN

## 2020-12-14 MED ORDER — SENNA 8.6 MG PO TABS: 1.0000 | ORAL_TABLET | Freq: Two times a day (BID) | ORAL | 0 refills | Status: DC

## 2020-12-14 NOTE — Discharge Summary (Signed)
Physician Discharge Summary  Patient ID: [name] MRN: [id number] DOB/AGE: [birth year] 60 y.o.  Admit date: 12/13/2020 Discharge date: 12/15/2020  Admission Diagnoses:  Degenerative arthritis of right knee  Discharge Diagnoses:  Principal Problem:   Degenerative arthritis of right knee Active Problems:   Osteoarthritis of right knee   Past Medical History:  Diagnosis Date   Clotting disorder ([place])    DVT (deep venous thrombosis) ([facility])    Ectopic pregnancy    Fibroids    Hyperlipidemia    PE (pulmonary embolism)    Pneumonia    Umbilical hernia     Surgeries: Procedure(s): COMPUTER ASSISTED TOTAL KNEE ARTHROPLASTY on 12/13/2020   Consultants (if any):   Discharged Condition: Improved  Hospital Course: [name] is an 60 y.o. female who was admitted 12/13/2020 with a diagnosis of Degenerative arthritis of right knee and went to the operating room on 12/13/2020 and underwent the above named procedures.    She was given perioperative antibiotics:  Anti-infectives (From admission, onward)    Start     Dose/Rate Route Frequency Ordered Stop   12/13/20 1700  ceFAZolin (ANCEF) IVPB 2g/100 mL premix        2 g 200 mL/hr over 30 Minutes Intravenous Every 6 hours 12/13/20 1322 12/14/20 0819   12/13/20 0800  ceFAZolin (ANCEF) IVPB 2g/100 mL premix        2 g 200 mL/hr over 30 Minutes Intravenous On call to O.R. 12/13/20 0755 12/13/20 1048     .  She was given sequential compression devices, early ambulation, and apixaban for DVT prophylaxis.  She benefited maximally from the hospital stay and there were no complications.    Recent vital signs:  Vitals:   12/14/20 0110 12/14/20 0445  BP:  116/66  Pulse: (!) 55 (!) 55  Resp: 15 13  Temp:  97.6 F (36.4 C)  SpO2:      Recent laboratory studies:  Lab Results  Component Value Date   HGB 10.2 (L) 12/14/2020   HGB 12.1 11/30/2020   HGB 13.4 01/25/2020   Lab Results  Component Value Date   WBC 6.4  12/14/2020   PLT 225 12/14/2020   Lab Results  Component Value Date   INR 1.0 11/30/2020   Lab Results  Component Value Date   NA 135 12/14/2020   K 4.5 12/14/2020   CL 106 12/14/2020   CO2 25 12/14/2020   BUN 21 (H) 12/14/2020   CREATININE 0.94 12/14/2020   GLUCOSE 109 (H) 12/14/2020     WEIGHT BEARING   Weight bearing as tolerated with assist device (walker, cane, etc) as directed, use it as long as suggested by your surgeon or therapist, typically at least 4-6 weeks.   EXERCISES  Results after joint replacement surgery are often greatly improved when you follow the exercise, range of motion and muscle strengthening exercises prescribed by your doctor. Safety measures are also important to protect the joint from further injury. Any time any of these exercises cause you to have increased pain or swelling, decrease what you are doing until you are comfortable again and then slowly increase them. If you have problems or questions, call your caregiver or physical therapist for advice.   Rehabilitation is important following a joint replacement. After just a few days of immobilization, the muscles of the leg can become weakened and shrink (atrophy).  These exercises are designed to build up the tone and strength of the thigh and leg muscles and to improve  motion. Often times heat used for twenty to thirty minutes before working out will loosen up your tissues and help with improving the range of motion but do not use heat for the first two weeks following surgery (sometimes heat can increase post-operative swelling).   These exercises can be done on a training (exercise) mat, on the floor, on a table or on a bed. Use whatever works the best and is most comfortable for you.    Use music or television while you are exercising so that the exercises are a pleasant break in your day. This will make your life better with the exercises acting as a break in your routine that you can look forward  to.   Perform all exercises about fifteen times, three times per day or as directed.  You should exercise both the operative leg and the other leg as well.  Exercises include:   Quad Sets - Tighten up the muscle on the front of the thigh (Quad) and hold for 5-10 seconds.   Straight Leg Raises - With your knee straight (if you were given a brace, keep it on), lift the leg to 60 degrees, hold for 3 seconds, and slowly lower the leg.  Perform this exercise against resistance later as your leg gets stronger.  Leg Slides: Lying on your back, slowly slide your foot toward your buttocks, bending your knee up off the floor (only go as far as is comfortable). Then slowly slide your foot back down until your leg is flat on the floor again.  Angel Wings: Lying on your back spread your legs to the side as far apart as you can without causing discomfort.  Hamstring Strength:  Lying on your back, push your heel against the floor with your leg straight by tightening up the muscles of your buttocks.  Repeat, but this time bend your knee to a comfortable angle, and push your heel against the floor.  You may put a pillow under the heel to make it more comfortable if necessary.   A rehabilitation program following joint replacement surgery can speed recovery and prevent re-injury in the future due to weakened muscles. Contact your doctor or a physical therapist for more information on knee rehabilitation.    CONSTIPATION  Constipation is defined medically as fewer than three stools per week and severe constipation as less than one stool per week.  Even if you have a regular bowel pattern at home, your normal regimen is likely to be disrupted due to multiple reasons following surgery.  Combination of anesthesia, postoperative narcotics, change in appetite and fluid intake all can affect your bowels.   YOU MUST use at least one of the following options; they are listed in order of increasing strength to get the job  done.  They are all available over the counter, and you may need to use some, POSSIBLY even all of these options:    Drink plenty of fluids (prune juice may be helpful) and high fiber foods Colace 100 mg by mouth twice a day  Senokot for constipation as directed and as needed Dulcolax (bisacodyl), take with full glass of water  Miralax (polyethylene glycol) once or twice a day as needed.  If you have tried all these things and are unable to have a bowel movement in the first 3-4 days after surgery call either your surgeon or your primary doctor.    If you experience loose stools or diarrhea, hold the medications until you stool forms back up.  If your symptoms do not get better within 1 week or if they get worse, check with your doctor.  If you experience "the worst abdominal pain ever" or develop nausea or vomiting, please contact the office immediately for further recommendations for treatment.  POST-OPERATIVE OPIOID TAPER INSTRUCTIONS: It is important to wean off of your opioid medication as soon as possible. If you do not need pain medication after your surgery it is ok to stop day one. Opioids include: Codeine, Hydrocodone(Norco, Vicodin), Oxycodone(Percocet, oxycontin) and hydromorphone amongst others.  Long term and even short term use of opiods can cause: Increased pain response Dependence Constipation Depression Respiratory depression And more.  Withdrawal symptoms can include Flu like symptoms Nausea, vomiting And more Techniques to manage these symptoms Hydrate well Eat regular healthy meals Stay active Use relaxation techniques(deep breathing, meditating, yoga) Do Not substitute Alcohol to help with tapering If you have been on opioids for less than two weeks and do not have pain than it is ok to stop all together.  Plan to wean off of opioids This plan should start within one week post op of your joint replacement. Maintain the same interval or time between taking each  dose and first decrease the dose.  Cut the total daily intake of opioids by one tablet each day Next start to increase the time between doses. The last dose that should be eliminated is the evening dose.    Dental Antibiotics:  In most cases prophylactic antibiotics for Dental procdeures after total joint surgery are not necessary.  Exceptions are as follows:  1. History of prior total joint infection  2. Severely immunocompromised (Organ Transplant, cancer chemotherapy, Rheumatoid biologic meds such as [place])  3. Poorly controlled diabetes (A1C &gt; 8.0, blood glucose over 200)  If you have one of these conditions, contact your surgeon for an antibiotic prescription, prior to your dental procedure.  ITCHING:  If you experience itching with your medications, try taking only a single pain pill, or even half a pain pill at a time.  You can also use Benadryl over the counter for itching or also to help with sleep.   TED HOSE STOCKINGS:  Use stockings on both legs until for at least 2 weeks or as directed by physician office. They may be removed at night for sleeping.  MEDICATIONS:  See your medication summary on the "After Visit Summary" that nursing will review with you.  You may have some home medications which will be placed on hold until you complete the course of blood thinner medication.  It is important for you to complete the blood thinner medication as prescribed.  PRECAUTIONS:  If you experience chest pain or shortness of breath - call 911 immediately for transfer to the hospital emergency department.   If you develop a fever greater that 101 F, purulent drainage from wound, increased redness or drainage from wound, foul odor from the wound/dressing, or calf pain - CONTACT YOUR SURGEON.                                                   FOLLOW-UP APPOINTMENTS:  If you do not already have a post-op appointment, please call the office for an appointment to be seen by your surgeon.   Guidelines for how soon to be seen are listed in your "After Visit Summary", but are  typically between 1-4 weeks after surgery.  OTHER INSTRUCTIONS:   Knee Replacement:  Do not place pillow under knee, focus on keeping the knee straight while resting. CPM instructions: 0-90 degrees, 2 hours in the morning, 2 hours in the afternoon, and 2 hours in the evening. Place foam block, curve side up under heel at all times except when in CPM or when walking.  DO NOT modify, tear, cut, or change the foam block in any way.   MAKE SURE YOU:  Understand these instructions.  Get help right away if you are not doing well or get worse.    Thank you for letting us be a part of your medical care team.  It is a privilege we respect greatly.  We hope these instructions will help you stay on track for a fast and full recovery!   Diagnostic Studies: DG Knee Right Port  Result Date: 12/13/2020 CLINICAL DATA:  Right knee replacement EXAM: PORTABLE RIGHT KNEE - 1-2 VIEW COMPARISON:  None. FINDINGS: Immediate postoperative changes of a right total knee arthroplasty. Hardware appears aligned and intact. No acute fracture or dislocation identified. Subcutaneous emphysema and overlying surgical staples anteriorly. IMPRESSION: Total right knee arthroplasty changes. Electronically Signed   By: Ofilia Neas M.D.   On: 12/13/2020 14:15    Disposition: Discharge disposition: 01-Home or Self Care      Discharge Instructions     Call MD / Call 911   Complete by: As directed    If you experience chest pain or shortness of breath, CALL 911 and be transported to the hospital emergency room.  If you develope a fever above 101 F, pus (white drainage) or increased drainage or redness at the wound, or calf pain, call your surgeon's office.   Constipation Prevention   Complete by: As directed    Drink plenty of fluids.  Prune juice may be helpful.  You may use a stool softener, such as Colace (over the counter) 100 mg twice  a day.  Use MiraLax (over the counter) for constipation as needed.   Diet - low sodium heart healthy   Complete by: As directed    Do not put a pillow under the knee. Place it under the heel.   Complete by: As directed    Driving restrictions   Complete by: As directed    No driving for 6 weeks   Increase activity slowly as tolerated   Complete by: As directed    Lifting restrictions   Complete by: As directed    No lifting for 6 weeks   Post-operative opioid taper instructions:   Complete by: As directed    POST-OPERATIVE OPIOID TAPER INSTRUCTIONS: It is important to wean off of your opioid medication as soon as possible. If you do not need pain medication after your surgery it is ok to stop day one. Opioids include: Codeine, Hydrocodone(Norco, Vicodin), Oxycodone(Percocet, oxycontin) and hydromorphone amongst others.  Long term and even short term use of opiods can cause: Increased pain response Dependence Constipation Depression Respiratory depression And more.  Withdrawal symptoms can include Flu like symptoms Nausea, vomiting And more Techniques to manage these symptoms Hydrate well Eat regular healthy meals Stay active Use relaxation techniques(deep breathing, meditating, yoga) Do Not substitute Alcohol to help with tapering If you have been on opioids for less than two weeks and do not have pain than it is ok to stop all together.  Plan to wean off of opioids This plan should start within one week  post op of your joint replacement. Maintain the same interval or time between taking each dose and first decrease the dose.  Cut the total daily intake of opioids by one tablet each day Next start to increase the time between doses. The last dose that should be eliminated is the evening dose.      TED hose   Complete by: As directed    Use stockings (TED hose) for 2 weeks on both leg(s).  You may remove them at night for sleeping.        Follow-up Information      Swinteck, Aaron Edelman, MD. Schedule an appointment as soon as possible for a visit in 2 week(s).   Specialty: Orthopedic Surgery Why: For suture removal Contact information: [address] [place] [address] [phone]                  Signed: Dorothyann Peng 12/14/2020, 10:17 AM

## 2020-12-14 NOTE — TOC Transition Note (Signed)
Transition of Care [facility]) - CM/SW Discharge Note  Patient Details  Name: [name] MRN: [id number] Date of Birth: 1960/07/24  Transition of Care [facility]) CM/SW Contact:  Sherie Don, LCSW Phone Number: 12/14/2020, 10:03 AM  Clinical Narrative: Patient is expected to discharge home after working with PT. CSW met with patient to confirm discharge plan and needs. Patient will discharge home with OPPT at [facility]. Patient has a 3N1 at home, but will need a rolling walker. [employer] delivered rolling walker to patient's room. TOC signing off.  Final next level of care: OP Rehab Barriers to Discharge: No Barriers Identified  Patient Goals and CMS Choice Patient states their goals for this hospitalization and ongoing recovery are:: Discharge home with [place] CMS Medicare.gov Compare Post Acute Care list provided to:: Patient Choice offered to / list presented to : Patient  Discharge Plan and Services        DME Arranged: Walker rolling DME Agency: [employer] Date DME Agency Contacted: 12/14/20 Representative spoke with at DME Agency: Ovid Curd  Readmission Risk Interventions No flowsheet data found.

## 2020-12-14 NOTE — Progress Notes (Signed)
Subjective: 1 Day Post-Op Procedure(s) (LRB): COMPUTER ASSISTED TOTAL KNEE ARTHROPLASTY (Right)  Patient reports pain as mild.    Objective:   VITALS:  Temp:  [97.6 F (36.4 C)-98 F (36.7 C)] 97.9 F (36.6 C) (12/02 2123) Pulse Rate:  [50-70] 70 (12/02 2123) Resp:  [12-18] 18 (12/02 2123) BP: (104-121)/(59-88) 109/88 (12/02 2123) SpO2:  [95 %-99 %] 95 % (12/02 2123)  Neurovascular intact Sensation intact distally Intact pulses distally Dorsiflexion/Plantar flexion intact Incision: dressing C/D/I No cellulitis present Compartment soft   LABS Recent Labs    12/14/20 0339  HGB 10.2*  WBC 6.4  PLT 225   Recent Labs    12/14/20 0339  NA 135  K 4.5  CL 106  CO2 25  BUN 21*  CREATININE 0.94  GLUCOSE 109*   No results for input(s): LABPT, INR in the last 72 hours.   Assessment/Plan: 1 Day Post-Op Procedure(s) (LRB): COMPUTER ASSISTED TOTAL KNEE ARTHROPLASTY (Right)  Advance diet Up with therapy D/C IV fluids Discharge home with home health  Armond Hang 12/14/2020, 10:14 PM

## 2020-12-14 NOTE — Progress Notes (Signed)
    Subjective:  Patient reports pain as mild to moderate.  Denies N/V/CP/SOB.   Objective:   VITALS:   Vitals:   12/13/20 2139 12/14/20 0059 12/14/20 0110 12/14/20 0445  BP: 139/76 104/66  116/66  Pulse: 64 (!) 50 (!) 55 (!) 55  Resp: 13 12 15 13   Temp: 97.8 F (36.6 C) 97.6 F (36.4 C)  97.6 F (36.4 C)  TempSrc: Oral Oral  Oral  SpO2:      Weight:      Height:        NAD ABD soft Neurovascular intact Sensation intact distally Intact pulses distally Dorsiflexion/Plantar flexion intact Incision: dressing C/D/I   Lab Results  Component Value Date   WBC 6.4 12/14/2020   HGB 10.2 (L) 12/14/2020   HCT 31.8 (L) 12/14/2020   MCV 93.8 12/14/2020   PLT 225 12/14/2020   BMET    Component Value Date/Time   NA 135 12/14/2020 0339   K 4.5 12/14/2020 0339   CL 106 12/14/2020 0339   CO2 25 12/14/2020 0339   GLUCOSE 109 (H) 12/14/2020 0339   BUN 21 (H) 12/14/2020 0339   CREATININE 0.94 12/14/2020 0339   CALCIUM 8.4 (L) 12/14/2020 0339   GFRNONAA >60 12/14/2020 4742     Assessment/Plan: 1 Day Post-Op   Principal Problem:   Degenerative arthritis of right knee Active Problems:   Osteoarthritis of right knee   WBAT with walker DVT ppx: Aspirin, SCDs, TEDS PO pain control PT/OT Dispo: D/C home once cleared by therapy     Dorothyann Peng 12/14/2020, 8:01 AM  [facility] is now [address], [place], [place] [address] Phone: [phone] [email or web address] Facebook  [employer]

## 2020-12-14 NOTE — Progress Notes (Signed)
Physical Therapy Treatment Patient Details Name: [name] MRN: [id number] DOB: 1960/05/15 Today's Date: 12/14/2020   History of Present Illness Pt is a 60 year old female s/p R TKA with hx of PE, DVT    PT Comments    Pt assisted to bathroom and then ambulated in hallway.  Pt also practiced safe stair technique.  Pt reports nausea end of ambulation however no emesis this afternoon.  Pt with 8/10 pain and RN into room with pain pills end of session.  Pt's mobility improving however pt not feeling well and would like medications to work prior to d/c home.  RN aware.    Recommendations for follow up therapy are one component of a multi-disciplinary discharge planning process, led by the attending physician.  Recommendations may be updated based on patient status, additional functional criteria and insurance authorization.  Follow Up Recommendations  Follow physician's recommendations for discharge plan and follow up therapies     Assistance Recommended at Discharge    Equipment Recommendations  Rolling walker (2 wheels)    Recommendations for Other Services       Precautions / Restrictions Precautions Precautions: Fall;Knee Restrictions Other Position/Activity Restrictions: WBAT     Mobility  Bed Mobility               General bed mobility comments: pt in recliner    Transfers Overall transfer level: Needs assistance Equipment used: Rolling walker (2 wheels) Transfers: Sit to/from Stand Sit to Stand: Min guard           General transfer comment: verbal cues for UE and LE positioning    Ambulation/Gait Ambulation/Gait assistance: Min guard Gait Distance (Feet): 120 Feet Assistive device: Rolling walker (2 wheels) Gait Pattern/deviations: Step-to pattern;Antalgic;Decreased stance time - right       General Gait Details: verbal cues for sequence, step length, posture   Stairs Stairs: Yes Stairs assistance: Min guard Stair Management: Step to  pattern;Backwards;With walker Number of Stairs: 2 General stair comments: initially performed forwards with rails however pt reports her rails widen and she wouldn't be able to reach both at the same time so also practiced as above, backwards with RW, pt performed backwards with RW twice and reports understanding   Wheelchair Mobility    Modified Rankin (Stroke Patients Only)       Balance                                            Cognition Arousal/Alertness: Awake/alert Behavior During Therapy: WFL for tasks assessed/performed Overall Cognitive Status: Within Functional Limits for tasks assessed                                          Exercises      General Comments        Pertinent Vitals/Pain Pain Assessment: 0-10 Pain Score: 8  Pain Location: right knee Pain Descriptors / Indicators: Sore;Aching;Grimacing Pain Intervention(s): Monitored during session;Repositioned;RN gave pain meds during session    Home Living                          Prior Function            PT Goals (current goals can now be found in  the care plan section) Progress towards PT goals: Progressing toward goals    Frequency    7X/week      PT Plan Current plan remains appropriate    Co-evaluation              AM-PAC PT "6 Clicks" Mobility   Outcome Measure  Help needed turning from your back to your side while in a flat bed without using bedrails?: A Little Help needed moving from lying on your back to sitting on the side of a flat bed without using bedrails?: A Little Help needed moving to and from a bed to a chair (including a wheelchair)?: A Little Help needed standing up from a chair using your arms (e.g., wheelchair or bedside chair)?: A Little Help needed to walk in hospital room?: A Little Help needed climbing 3-5 steps with a railing? : A Little 6 Click Score: 18    End of Session Equipment Utilized During  Treatment: Gait belt Activity Tolerance: Patient tolerated treatment well Patient left: in chair;with call bell/phone within reach;with nursing/sitter in room Nurse Communication: Mobility status PT Visit Diagnosis: Other abnormalities of gait and mobility (R26.89)     Time: 2353-6144 PT Time Calculation (min) (ACUTE ONLY): 31 min  Charges:  $Gait Training: 23-37 mins                     Jannette Spanner PT, DPT Acute Rehabilitation Services Pager: [phone] Office: [place] 12/14/2020, 4:02 PM

## 2020-12-14 NOTE — Evaluation (Signed)
Physical Therapy Evaluation Patient Details Name: [name] MRN: [id number] DOB: 09-03-1960 Today's Date: 12/14/2020  History of Present Illness  Pt is a 60 year old female s/p R TKA with hx of PE, DVT  Clinical Impression  Pt is s/p TKA resulting in the deficits listed below (see PT Problem List). Pt will benefit from skilled PT to increase their independence and safety with mobility to allow discharge to the venue listed below.  Pt assisted with ambulating in hallway and reported nausea and dizziness upon returning to room.  Pt repositioned in recliner and had emesis episode.  BP 120/92 mmHg and HR 146 bpm (RN notified).  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      Recommendations for follow up therapy are one component of a multi-disciplinary discharge planning process, led by the attending physician.  Recommendations may be updated based on patient status, additional functional criteria and insurance authorization.  Follow Up Recommendations Follow physician's recommendations for discharge plan and follow up therapies    Assistance Recommended at Discharge    Functional Status Assessment Patient has had a recent decline in their functional status and demonstrates the ability to make significant improvements in function in a reasonable and predictable amount of time.  Equipment Recommendations  Rolling walker (2 wheels)    Recommendations for Other Services       Precautions / Restrictions Precautions Precautions: Fall;Knee Restrictions Weight Bearing Restrictions: No Other Position/Activity Restrictions: WBAT      Mobility  Bed Mobility Overal bed mobility: Needs Assistance Bed Mobility: Supine to Sit     Supine to sit: Min guard;HOB elevated     General bed mobility comments: verbal cues for self assist of Rt LE    Transfers Overall transfer level: Needs assistance Equipment used: Rolling  walker (2 wheels) Transfers: Sit to/from Stand Sit to Stand: Min guard           General transfer comment: verbal cues for UE and LE positioning    Ambulation/Gait Ambulation/Gait assistance: Min guard Gait Distance (Feet): 120 Feet Assistive device: Rolling walker (2 wheels) Gait Pattern/deviations: Step-to pattern;Antalgic;Decreased stance time - right       General Gait Details: verbal cues for sequence, step length, posture  Stairs            Wheelchair Mobility    Modified Rankin (Stroke Patients Only)       Balance                                             Pertinent Vitals/Pain Pain Assessment: 0-10 Pain Score: 8  Pain Location: right knee Pain Descriptors / Indicators: Sore;Aching;Grimacing Pain Intervention(s): Repositioned;Monitored during session    Home Living Family/patient expects to be discharged to:: Private residence Living Arrangements: Parent;Other relatives (sister) Available Help at Discharge: Family;Available PRN/intermittently Type of Home: House Home Access: Stairs to enter Entrance Stairs-Rails: Right Entrance Stairs-Number of Steps: 3   Home Layout: Able to live on main level with bedroom/bathroom Home Equipment: None      Prior Function Prior Level of Function : Independent/Modified Independent             Mobility Comments: pt assists with taking care of her mother with dementia, sister also lives with pt and assists       Hand  Dominance        Extremity/Trunk Assessment        Lower Extremity Assessment Lower Extremity Assessment: RLE deficits/detail RLE Deficits / Details: requiring assist for bed mobility, unable to perform SLR, observed at least 30* knee flexion AROM sitting EOB       Communication      Cognition Arousal/Alertness: Awake/alert Behavior During Therapy: WFL for tasks assessed/performed Overall Cognitive Status: Within Functional Limits for tasks assessed                                           General Comments      Exercises     Assessment/Plan    PT Assessment Patient needs continued PT services  PT Problem List Decreased range of motion;Pain;Decreased knowledge of use of DME;Decreased strength;Decreased mobility;Decreased knowledge of precautions       PT Treatment Interventions Stair training;Gait training;DME instruction;Therapeutic exercise;Therapeutic activities;Functional mobility training;Patient/family education    PT Goals (Current goals can be found in the Care Plan section)  Acute Rehab PT Goals PT Goal Formulation: With patient Time For Goal Achievement: 12/21/20 Potential to Achieve Goals: Good    Frequency 7X/week   Barriers to discharge        Co-evaluation               AM-PAC PT "6 Clicks" Mobility  Outcome Measure Help needed turning from your back to your side while in a flat bed without using bedrails?: A Little Help needed moving from lying on your back to sitting on the side of a flat bed without using bedrails?: A Little Help needed moving to and from a bed to a chair (including a wheelchair)?: A Little Help needed standing up from a chair using your arms (e.g., wheelchair or bedside chair)?: A Little Help needed to walk in hospital room?: A Little Help needed climbing 3-5 steps with a railing? : A Lot 6 Click Score: 17    End of Session Equipment Utilized During Treatment: Gait belt Activity Tolerance: Patient tolerated treatment well Patient left: in chair;with call bell/phone within reach Nurse Communication: Mobility status PT Visit Diagnosis: Other abnormalities of gait and mobility (R26.89)    Time: 0102-7253 PT Time Calculation (min) (ACUTE ONLY): 28 min   Charges:   PT Evaluation $PT Eval Low Complexity: 1 Low PT Treatments $Gait Training: 8-22 mins      Jannette Spanner PT, DPT Acute Rehabilitation Services Pager: [phone] Office: [place] 12/14/2020, 10:55 AM

## 2020-12-15 DIAGNOSIS — M179 Osteoarthritis of knee, unspecified: Secondary | ICD-10-CM | POA: Diagnosis not present

## 2020-12-15 LAB — CBC
HCT: 30.6 % — ABNORMAL LOW (ref 36.0–46.0)
Hemoglobin: 9.9 g/dL — ABNORMAL LOW (ref 12.0–15.0)
MCH: 30 pg (ref 26.0–34.0)
MCHC: 32.4 g/dL (ref 30.0–36.0)
MCV: 92.7 fL (ref 80.0–100.0)
Platelets: 231 10*3/uL (ref 150–400)
RBC: 3.3 MIL/uL — ABNORMAL LOW (ref 3.87–5.11)
RDW: 14.5 % (ref 11.5–15.5)
WBC: 6.6 10*3/uL (ref 4.0–10.5)
nRBC: 0 % (ref 0.0–0.2)

## 2020-12-15 NOTE — Progress Notes (Signed)
Physical Therapy Treatment Patient Details Name: [name] MRN: [id number] DOB: 1960/10/14 Today's Date: 12/15/2020   History of Present Illness Pt is a 60 year old female s/p R TKA with hx of PE, DVT    PT Comments    Pt participated well. She continues to have some lightheadedness when mobilizing (possibly due to [facility] stated she is not used to taking current meds). Reviewed/practiced exercises, gait training and stair training. All education completed. No further questions/concerns from pt.     Recommendations for follow up therapy are one component of a multi-disciplinary discharge planning process, led by the attending physician.  Recommendations may be updated based on patient status, additional functional criteria and insurance authorization.  Follow Up Recommendations  Follow physician's recommendations for discharge plan and follow up therapies     Assistance Recommended at Discharge Intermittent Supervision/Assistance  Equipment Recommendations       Recommendations for Other Services       Precautions / Restrictions Precautions Precautions: Fall;Knee Restrictions Weight Bearing Restrictions: No Other Position/Activity Restrictions: WBAT     Mobility  Bed Mobility Overal bed mobility: Needs Assistance Bed Mobility: Supine to Sit;Sit to Supine     Supine to sit: Min guard;HOB elevated Sit to supine: Min guard;HOB elevated   General bed mobility comments: pt used gait belt as leg lifter.    Transfers Overall transfer level: Needs assistance Equipment used: Rolling walker (2 wheels) Transfers: Sit to/from Stand Sit to Stand: Min guard           General transfer comment: Cues for safety, hand/LE placement.    Ambulation/Gait Ambulation/Gait assistance: Min guard Gait Distance (Feet): 75 Feet Assistive device: Rolling walker (2 wheels) Gait Pattern/deviations: Step-to pattern       General Gait Details: MIn guard for safety. Pt reports  some lightheadedness.   Stairs Stairs: Yes Stairs assistance: Min assist Stair Management: Step to pattern;Backwards;With walker Number of Stairs: 2 General stair comments: Practiced stairs again on today at pt's request. Only requires assist to steady RW. Cues for safety, technique, sequence.   Wheelchair Mobility    Modified Rankin (Stroke Patients Only)       Balance Overall balance assessment: Needs assistance         Standing balance support: Reliant on assistive device for balance;Bilateral upper extremity supported Standing balance-Leahy Scale: Fair                              Cognition Arousal/Alertness: Awake/alert Behavior During Therapy: WFL for tasks assessed/performed Overall Cognitive Status: Within Functional Limits for tasks assessed                                          Exercises Total Joint Exercises Ankle Circles/Pumps: AROM;Both Quad Sets: AROM;10 reps;Right Hip ABduction/ADduction: AROM;AAROM;Right;10 reps Straight Leg Raises: AROM;AAROM;Right;10 reps Knee Flexion: AAROM;Right;Seated Goniometric ROM: ~10-65 degrees    General Comments        Pertinent Vitals/Pain Pain Assessment: 0-10 Pain Score: 7  Pain Location: R knee Pain Descriptors / Indicators: Discomfort;Sore;Aching Pain Intervention(s): Limited activity within patient's tolerance;Monitored during session;Ice applied;Repositioned    Home Living                          Prior Function  PT Goals (current goals can now be found in the care plan section) Progress towards PT goals: Progressing toward goals    Frequency    7X/week      PT Plan Current plan remains appropriate    Co-evaluation              AM-PAC PT "6 Clicks" Mobility   Outcome Measure  Help needed turning from your back to your side while in a flat bed without using bedrails?: A Little Help needed moving from lying on your back to sitting  on the side of a flat bed without using bedrails?: A Little Help needed moving to and from a bed to a chair (including a wheelchair)?: A Little Help needed standing up from a chair using your arms (e.g., wheelchair or bedside chair)?: A Little Help needed to walk in hospital room?: A Little Help needed climbing 3-5 steps with a railing? : A Little 6 Click Score: 18    End of Session Equipment Utilized During Treatment: Gait belt Activity Tolerance: Patient tolerated treatment well Patient left: in bed;with call bell/phone within reach   PT Visit Diagnosis: Other abnormalities of gait and mobility (R26.89);Pain Pain - Right/Left: Right Pain - part of body: Knee     Time: 6659-9357 PT Time Calculation (min) (ACUTE ONLY): 33 min  Charges:  $Gait Training: 8-22 mins $Therapeutic Exercise: 8-22 mins                       Doreatha Massed, PT Acute Rehabilitation  Office: [phone] Pager: [phone]

## 2020-12-18 ENCOUNTER — Encounter (HOSPITAL_COMMUNITY): Payer: Self-pay | Admitting: Orthopedic Surgery

## 2020-12-21 ENCOUNTER — Telehealth: Payer: Self-pay | Admitting: Cardiovascular Disease

## 2020-12-21 ENCOUNTER — Ambulatory Visit (HOSPITAL_COMMUNITY)
Admission: RE | Admit: 2020-12-21 | Discharge: 2020-12-21 | Disposition: A | Discharge status: Home / Self Care | Payer: BC Managed Care – PPO | Source: Ambulatory Visit | Attending: Cardiovascular Disease | Admitting: Cardiovascular Disease

## 2020-12-21 ENCOUNTER — Other Ambulatory Visit: Payer: Self-pay

## 2020-12-21 ENCOUNTER — Other Ambulatory Visit (HOSPITAL_COMMUNITY): Payer: Self-pay | Admitting: Orthopedic Surgery

## 2020-12-21 DIAGNOSIS — M7989 Other specified soft tissue disorders: Secondary | ICD-10-CM | POA: Diagnosis present

## 2020-12-21 DIAGNOSIS — M79604 Pain in right leg: Secondary | ICD-10-CM | POA: Insufficient documentation

## 2020-12-21 NOTE — Telephone Encounter (Signed)
Patient would like the report to her LE VENOUS test faxed to [facility] PA-[facility] fax # [phone]

## 2021-02-28 IMAGING — CT CT ABD-PELV W/ CM
2 of 6 series · 13 of 46 positions shown, 15 images · IV contrast (APPLIED)
Comparison: Chest radiograph 01/26/2020, CT PA 09/05/2006

CLINICAL DATA: Unintended weight loss, pain in a sagittal bleeding
for 2 months, constipation and nausea left flank pain and frequent
urination

EXAM:
CT ABDOMEN AND PELVIS WITH CONTRAST
TECHNIQUE: Multidetector CT imaging of the abdomen and pelvis was performed
using the standard protocol following bolus administration of
intravenous contrast.
CONTRAST:  100mL OMNIPAQUE IOHEXOL 300 MG/ML  SOLN

[Series 4: abdomen 2.0 · axial · 0.88mm/px · z∈[+723,+1123]mm · 10 of 228 slices shown, 12 images]
[im 14/228  soft-tissue]
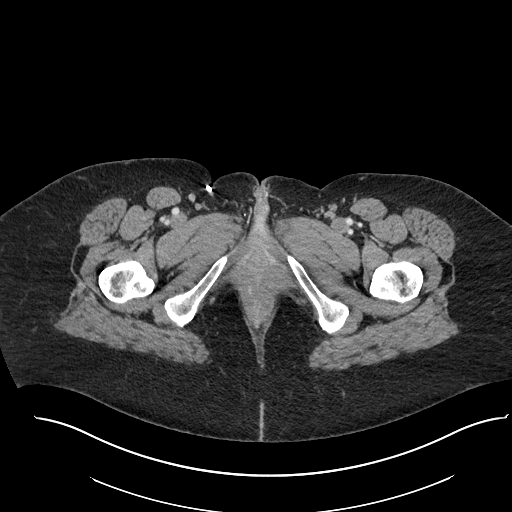
[im 14/228  bone]
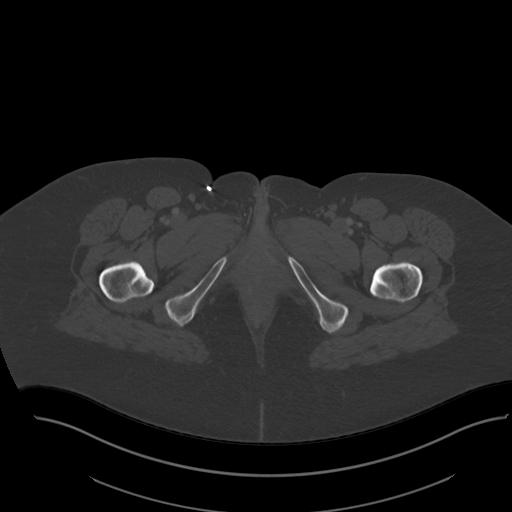
[im 41/228  soft-tissue]
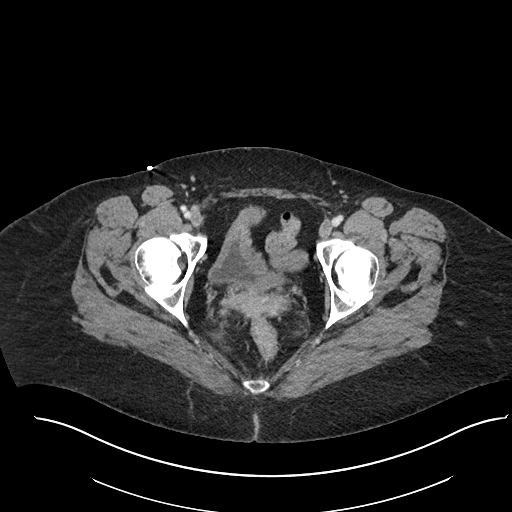
[im 67/228  soft-tissue]
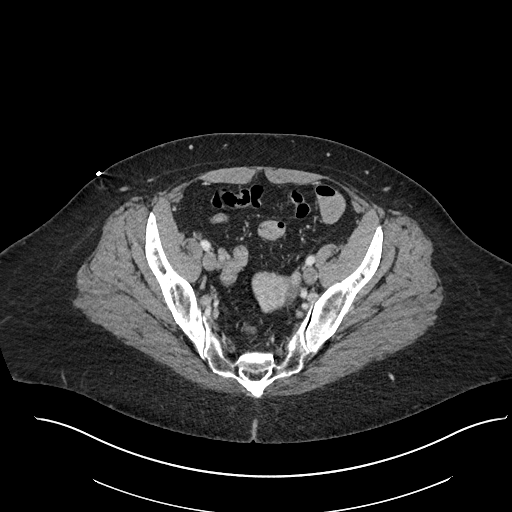
[im 81/228  soft-tissue]
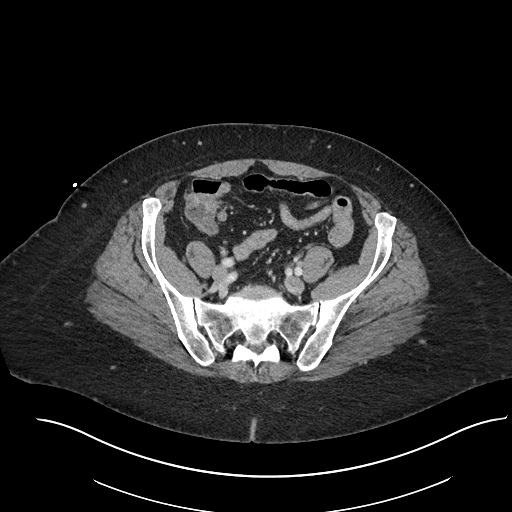
[im 107/228  soft-tissue]
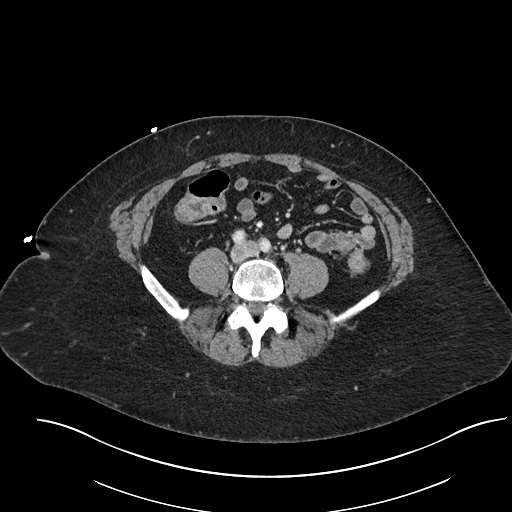
[im 121/228  soft-tissue]
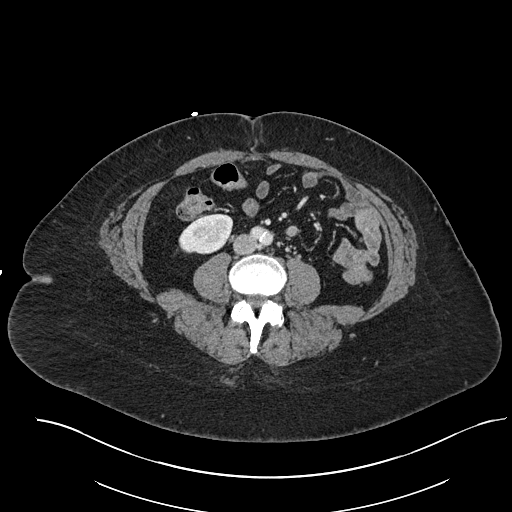
[im 147/228  soft-tissue]
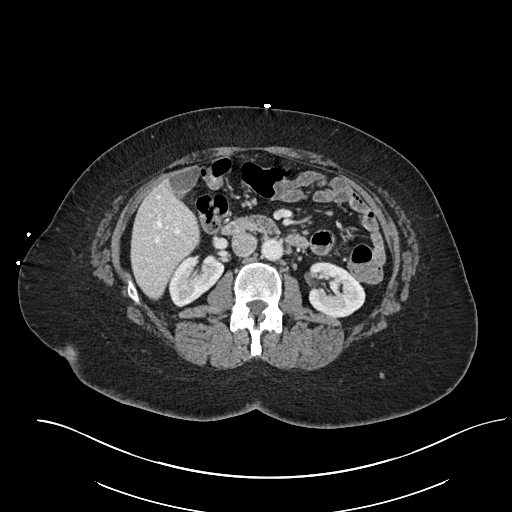
[im 174/228  soft-tissue]
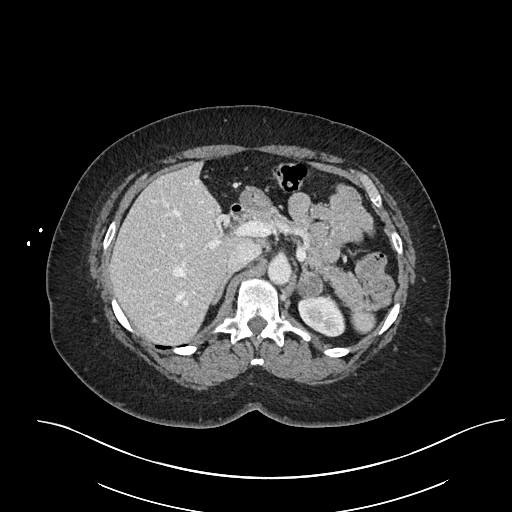
[im 187/228  soft-tissue]
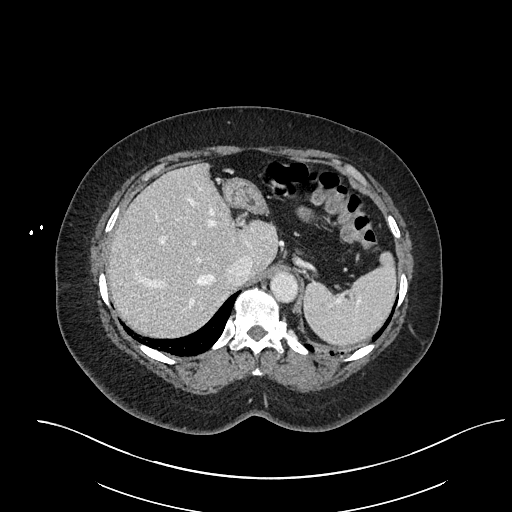
[im 187/228  bone]
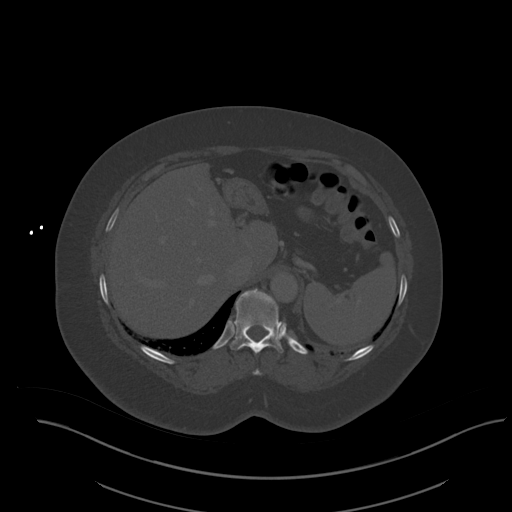
[im 214/228  soft-tissue]
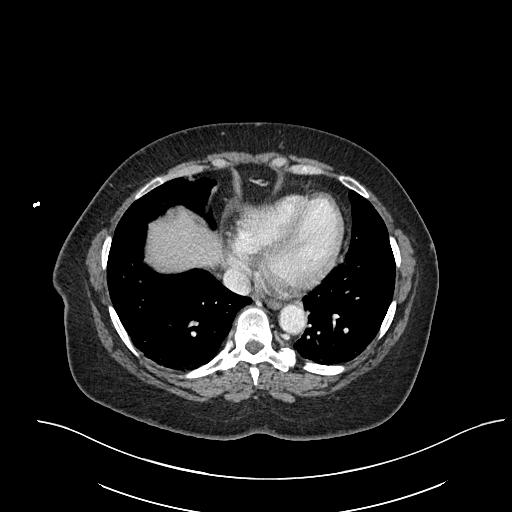

[Series 6: abdomen 3.0 mpr cor · coronal · 0.69mm/px · 3 of 111 slices shown]
[im 37/111  soft-tissue]
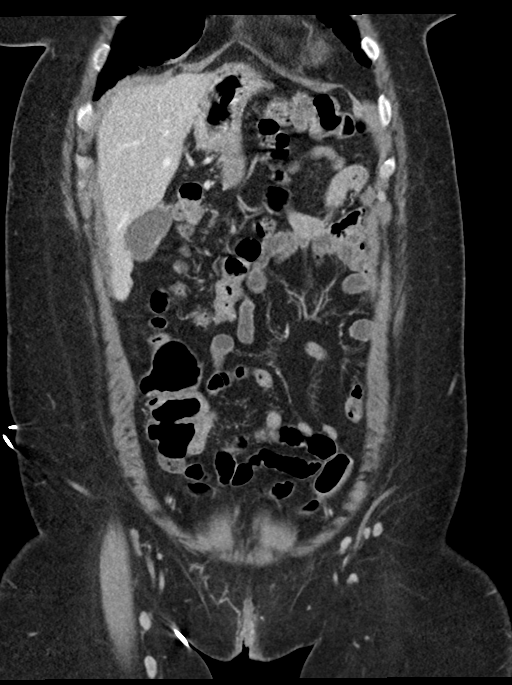
[im 49/111  soft-tissue]
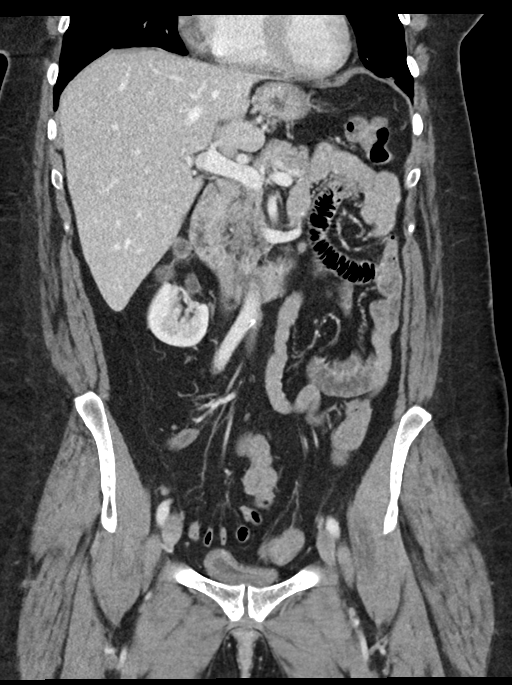
[im 62/111  soft-tissue]
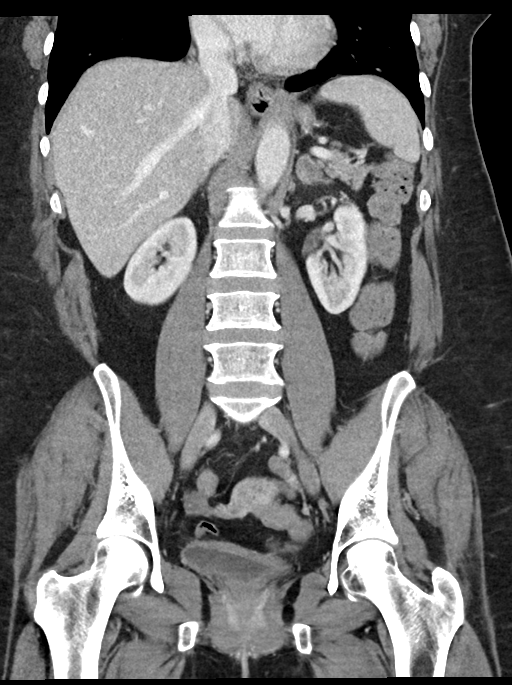

[13 of 46 positions shown; findings below may reference images not displayed]

FINDINGS: Lower chest: Some dependent atelectatic changes are present
posteriorly. Additional patchy areas of multifocal ground-glass are
present in the lower lungs bilaterally, could reflect an acute
infectious or inflammatory process including atypical viral
etiologies. Normal heart size. No pericardial effusion.

Hepatobiliary: Chronically diminutive appearance of the left lobe
liver, similar to comparison CT in 8228. No worrisome focal liver
lesions. Smooth liver surface contour. Normal hepatic attenuation.
Normal gallbladder and biliary tree without visible calcified
gallstone.

Pancreas: No pancreatic ductal dilatation or surrounding
inflammatory changes.

Spleen: Solitary 6 mm transiently hyperattenuating focus in the
posterior spleen ([DATE]), normalizing to background parenchymal
attenuation on delayed phase, incompletely characterized but could
reflect a small hemangioma. Normal splenic size. No other focal
splenic lesions.

Adrenals/Urinary Tract: 2 cm nodule left adrenal gland is not
significantly changed from comparison in 8228 and likely reflects a
benign adrenal adenoma. No other concerning focal adrenal nodules or
masses. Kidneys are normally located with symmetric enhancementand
excretion with bilateral extrarenal pelves. No suspicious renal
lesion, urolithiasis or hydronephrosis. Urinary bladder is largely
decompressed at the time of exam and therefore poorly evaluated by
CT imaging. Mild wall thickening is nonspecific given
underdistention though some mild mucosal hyperemia could suggest
underlying cystitis.

Stomach/Bowel: Small sliding-type hiatal hernia. Some mild gastric
wall thickening may be related to underdistention. No small bowel
thickening or dilatation. Normal duodenal sweep across the midline
abdomen. Normal appendix in the right lower quadrant. No colonic
dilatation or wall thickening.

Vascular/Lymphatic: Atherosclerotic calcifications within the
abdominal aorta and branch vessels. No aneurysm or ectasia. No
enlarged abdominopelvic lymph nodes.

Reproductive: Heterogeneously enhancing 4.3 x 4.6 cm lesion situated
predominantly along the left lateral aspect of the uterine body is
predominantly intramural but with some distortion of the
endometrium. Could reflect a leiomyoma though incompletely
characterized on this exam. Additional smaller subserosal 1 cm focus
at the anterior uterine fundus may reflect an additional small
leiomyoma. No concerning adnexal lesions.

Other: No abdominopelvic free fluid or free gas. No bowel containing
hernias.

Musculoskeletal: No acute osseous abnormality or suspicious osseous
lesion. Transitional thoracolumbar vertebrae with rudimentary ribs
versus elongated transverse processes. Schmorl's node formation at
what is presumed to be the T11 level.
IMPRESSION: 1. Mild bladder wall thickening is nonspecific given underdistention
though some mild mucosal hyperemia could suggest underlying
cystitis. Correlate with urinalysis.
2. Heterogeneously enhancing 4.3 x 4.6 cm lesion situated
predominantly along the left lateral aspect of the uterine body is
predominantly intramural but with some distortion of the
endometrium. Could reflect a leiomyoma though incompletely
characterized on this exam. Consider further evaluation with pelvic
ultrasound, particularly in the setting of dysfunctional uterine
bleeding. Additional smaller focus towards the fundus could reflect
additional tiny leiomyoma.
3. Small sliding-type hiatal hernia. Some mild gastric wall
thickening may be related to underdistention. Correlate for symptoms
of gastritis.
4. Patchy areas of multifocal ground-glass are present in the lower
lungs bilaterally, could reflect an acute infectious or inflammatory
process including atypical viral etiologies.
5. 2 cm left adrenal gland nodule is not significantly changed from
comparison in comparison in 8228 and likely reflects a benign
adrenal adenoma.
6. Solitary 6 mm hyperattenuating focus in the posterior spleen,
incompletely characterized but could reflect a small hemangioma.
Could consider 6-12 month follow-up MR. This recommendation follows
ACR consensus guidelines: White Paper of the ACR Incidental Findings
Committee II on Splenic and Nodal Findings. [HOSPITAL]
7. Aortic Atherosclerosis ([id number]).

## 2022-12-02 ENCOUNTER — Telehealth (HOSPITAL_COMMUNITY): Payer: Self-pay | Admitting: *Deleted

## 2022-12-02 NOTE — Telephone Encounter (Signed)
Received fax request to Dr Randie Heinz from Dr Linna Caprice for temporary IVC filter placement for upcoming TKA.  Will give to [facility]

## 2022-12-05 ENCOUNTER — Other Ambulatory Visit: Payer: Self-pay

## 2022-12-05 DIAGNOSIS — Z86718 Personal history of other venous thrombosis and embolism: Secondary | ICD-10-CM

## 2023-01-15 ENCOUNTER — Ambulatory Visit: Payer: Self-pay | Admitting: Student

## 2023-01-15 NOTE — Progress Notes (Signed)
 Surgery orders requested via Epic inbox with [facility], PA-C.

## 2023-01-21 NOTE — Patient Instructions (Signed)
 DUE TO COVID-19 ONLY TWO VISITORS  (aged 61 and older)  ARE ALLOWED TO COME WITH YOU AND STAY IN THE WAITING ROOM ONLY DURING PRE OP AND PROCEDURE.   **NO VISITORS ARE ALLOWED IN THE SHORT STAY AREA OR RECOVERY ROOM!!**  IF YOU WILL BE ADMITTED INTO THE HOSPITAL YOU ARE ALLOWED ONLY FOUR SUPPORT PEOPLE DURING VISITATION HOURS ONLY (7 AM -8PM)   The support person(s) must pass our screening, gel in and out, and wear a mask at all times, including in the patient's room. Patients must also wear a mask when staff or their support person are in the room. Visitors GUEST BADGE MUST BE WORN VISIBLY  One adult visitor may remain with you overnight and MUST be in the room by 8 P.M.     Your procedure is scheduled on: 01/29/23   Report to [facility] Main Entrance    Report to admitting at : 10:50 AM   Call this number if you have problems the morning of surgery [phone]   Do not eat food :After Midnight.   After Midnight you may have the following liquids until : 10:20 AM DAY OF SURGERY  Water  Black Coffee (sugar ok, NO MILK/CREAM OR CREAMERS)  Tea (sugar ok, NO MILK/CREAM OR CREAMERS) regular and decaf                             Plain Jell-O (NO RED)                                           Fruit ices (not with fruit pulp, NO RED)                                     Popsicles (NO RED)                                                                  Juice: apple, WHITE grape, WHITE cranberry Sports drinks like Gatorade (NO RED)   The day of surgery:  Drink ONE (1) Pre-Surgery Clear Ensure at : 10:20 AM the morning of surgery. Drink in one sitting. Do not sip.  This drink was given to you during your hospital  pre-op appointment visit. Nothing else to drink after completing the  Pre-Surgery Clear Ensure or G2.          If you have questions, please contact your surgeon's office.  FOLLOW ANY ADDITIONAL PRE OP INSTRUCTIONS YOU RECEIVED FROM YOUR SURGEON'S OFFICE!!!   Oral  Hygiene is also important to reduce your risk of infection.                                    Remember - BRUSH YOUR TEETH THE MORNING OF SURGERY WITH YOUR REGULAR TOOTHPASTE  DENTURES WILL BE REMOVED PRIOR TO SURGERY PLEASE DO NOT APPLY Poly grip OR ADHESIVES!!!   Do NOT smoke after Midnight   Take these medicines the morning of surgery  with A SIP OF WATER : NONE. Use eye drops as usual.Tylenol  as needed.                              You may not have any metal on your body including hair pins, jewelry, and body piercing             Do not wear make-up, lotions, powders, perfumes/cologne, or deodorant  Do not wear nail polish including gel and S&S, artificial/acrylic nails, or any other type of covering on natural nails including finger and toenails. If you have artificial nails, gel coating, etc. that needs to be removed by a nail salon please have this removed prior to surgery or surgery may need to be canceled/ delayed if the surgeon/ anesthesia feels like they are unable to be safely monitored.   Do not shave  48 hours prior to surgery.    Do not bring valuables to the hospital. [facility] IS NOT             RESPONSIBLE   FOR VALUABLES.   Contacts, glasses, or bridgework may not be worn into surgery.   Bring small overnight bag day of surgery.   DO NOT BRING YOUR HOME MEDICATIONS TO THE HOSPITAL. PHARMACY WILL DISPENSE MEDICATIONS LISTED ON YOUR MEDICATION LIST TO YOU DURING YOUR ADMISSION IN THE HOSPITAL!    Patients discharged on the day of surgery will not be allowed to drive home.  Someone NEEDS to stay with you for the first 24 hours after anesthesia.   Special Instructions: Bring a copy of your healthcare power of attorney and living will documents         the day of surgery if you haven't scanned them before.              Please read over the following fact sheets you were given: IF YOU HAVE QUESTIONS ABOUT YOUR PRE-OP INSTRUCTIONS PLEASE CALL [phone]       Pre-operative 5 CHG Bath Instructions   You can play a key role in reducing the risk of infection after surgery. Your skin needs to be as free of germs as possible. You can reduce the number of germs on your skin by washing with CHG (chlorhexidine  gluconate) soap before surgery. CHG is an antiseptic soap that kills germs and continues to kill germs even after washing.   DO NOT use if you have an allergy to chlorhexidine /CHG or antibacterial soaps. If your skin becomes reddened or irritated, stop using the CHG and notify one of our RNs at : [phone].   Please shower with the CHG soap starting 4 days before surgery using the following schedule:     Please keep in mind the following:  DO NOT shave, including legs and underarms, starting the day of your first shower.   You may shave your face at any point before/day of surgery.  Place clean sheets on your bed the day you start using CHG soap. Use a clean washcloth (not used since being washed) for each shower. DO NOT sleep with pets once you start using the CHG.   CHG Shower Instructions:  If you choose to wash your hair and private area, wash first with your normal shampoo/soap.  After you use shampoo/soap, rinse your hair and body thoroughly to remove shampoo/soap residue.  Turn the water  OFF and apply about 3 tablespoons (45 ml) of CHG soap to a CLEAN washcloth.  Apply  CHG soap ONLY FROM YOUR NECK DOWN TO YOUR TOES (washing for 3-5 minutes)  DO NOT use CHG soap on face, private areas, open wounds, or sores.  Pay special attention to the area where your surgery is being performed.  If you are having back surgery, having someone wash your back for you may be helpful. Wait 2 minutes after CHG soap is applied, then you may rinse off the CHG soap.  Pat dry with a clean towel  Put on clean clothes/pajamas   If you choose to wear lotion, please use ONLY the CHG-compatible lotions on the back of this paper.     Additional instructions  for the day of surgery: DO NOT APPLY any lotions, deodorants, cologne, or perfumes.   Put on clean/comfortable clothes.  Brush your teeth.  Ask your nurse before applying any prescription medications to the skin.   CHG Compatible Lotions   Aveeno Moisturizing lotion  Cetaphil Moisturizing Cream  Cetaphil Moisturizing Lotion  Clairol Herbal Essence Moisturizing Lotion, Dry Skin  Clairol Herbal Essence Moisturizing Lotion, Extra Dry Skin  Clairol Herbal Essence Moisturizing Lotion, Normal Skin  Curel Age Defying Therapeutic Moisturizing Lotion with Alpha Hydroxy  Curel Extreme Care Body Lotion  Curel Soothing Hands Moisturizing Hand Lotion  Curel Therapeutic Moisturizing Cream, Fragrance-Free  Curel Therapeutic Moisturizing Lotion, Fragrance-Free  Curel Therapeutic Moisturizing Lotion, Original Formula  Eucerin Daily Replenishing Lotion  Eucerin Dry Skin Therapy Plus Alpha Hydroxy Crme  Eucerin Dry Skin Therapy Plus Alpha Hydroxy Lotion  Eucerin Original Crme  Eucerin Original Lotion  Eucerin Plus Crme Eucerin Plus Lotion  Eucerin TriLipid Replenishing Lotion  Keri Anti-Bacterial Hand Lotion  Keri Deep Conditioning Original Lotion Dry Skin Formula Softly Scented  Keri Deep Conditioning Original Lotion, Fragrance Free Sensitive Skin Formula  Keri Lotion Fast Absorbing Fragrance Free Sensitive Skin Formula  Keri Lotion Fast Absorbing Softly Scented Dry Skin Formula  Keri Original Lotion  Keri Skin Renewal Lotion Keri Silky Smooth Lotion  Keri Silky Smooth Sensitive Skin Lotion  Nivea Body Creamy Conditioning Oil  Nivea Body Extra Enriched Lotion  Nivea Body Original Lotion  Nivea Body Sheer Moisturizing Lotion Nivea Crme  Nivea Skin Firming Lotion  NutraDerm 30 Skin Lotion  NutraDerm Skin Lotion  NutraDerm Therapeutic Skin Cream  NutraDerm Therapeutic Skin Lotion  ProShield Protective Hand Cream  Provon moisturizing lotion   Incentive Spirometer  An incentive  spirometer is a tool that can help keep your lungs clear and active. This tool measures how well you are filling your lungs with each breath. Taking long deep breaths may help reverse or decrease the chance of developing breathing (pulmonary) problems (especially infection) following: A long period of time when you are unable to move or be active. BEFORE THE PROCEDURE  If the spirometer includes an indicator to show your best effort, your nurse or respiratory therapist will set it to a desired goal. If possible, sit up straight or lean slightly forward. Try not to slouch. Hold the incentive spirometer in an upright position. INSTRUCTIONS FOR USE  Sit on the edge of your bed if possible, or sit up as far as you can in bed or on a chair. Hold the incentive spirometer in an upright position. Breathe out normally. Place the mouthpiece in your mouth and seal your lips tightly around it. Breathe in slowly and as deeply as possible, raising the piston or the ball toward the top of the column. Hold your breath for 3-5 seconds or for as long as possible.  Allow the piston or ball to fall to the bottom of the column. Remove the mouthpiece from your mouth and breathe out normally. Rest for a few seconds and repeat Steps 1 through 7 at least 10 times every 1-2 hours when you are awake. Take your time and take a few normal breaths between deep breaths. The spirometer may include an indicator to show your best effort. Use the indicator as a goal to work toward during each repetition. After each set of 10 deep breaths, practice coughing to be sure your lungs are clear. If you have an incision (the cut made at the time of surgery), support your incision when coughing by placing a pillow or rolled up towels firmly against it. Once you are able to get out of bed, walk around indoors and cough well. You may stop using the incentive spirometer when instructed by your caregiver.  RISKS AND COMPLICATIONS Take your time  so you do not get dizzy or light-headed. If you are in pain, you may need to take or ask for pain medication before doing incentive spirometry. It is harder to take a deep breath if you are having pain. AFTER USE Rest and breathe slowly and easily. It can be helpful to keep track of a log of your progress. Your caregiver can provide you with a simple table to help with this. If you are using the spirometer at home, follow these instructions: SEEK MEDICAL CARE IF:  You are having difficultly using the spirometer. You have trouble using the spirometer as often as instructed. Your pain medication is not giving enough relief while using the spirometer. You develop fever of 100.5 F (38.1 C) or higher. SEEK IMMEDIATE MEDICAL CARE IF:  You cough up bloody sputum that had not been present before. You develop fever of 102 F (38.9 C) or greater. You develop worsening pain at or near the incision site. MAKE SURE YOU:  Understand these instructions. Will watch your condition. Will get help right away if you are not doing well or get worse. Document Released: 05/12/2006 Document Revised: 03/24/2011 Document Reviewed: 07/13/2006 [facility] Patient Information 2014 [place], [place].   ________________________________________________________________________

## 2023-01-22 ENCOUNTER — Encounter (HOSPITAL_COMMUNITY)
Admission: RE | Admit: 2023-01-22 | Discharge: 2023-01-22 | Disposition: A | Discharge status: Home / Self Care | Payer: BC Managed Care – PPO | Source: Ambulatory Visit | Attending: Orthopedic Surgery | Admitting: Orthopedic Surgery

## 2023-01-22 ENCOUNTER — Encounter (HOSPITAL_COMMUNITY): Payer: Self-pay

## 2023-01-22 ENCOUNTER — Other Ambulatory Visit: Payer: Self-pay

## 2023-01-22 VITALS — BP 128/70 | HR 91 | Temp 98.6°F | Ht 65.0 in | Wt 243.0 lb

## 2023-01-22 DIAGNOSIS — R9431 Abnormal electrocardiogram [ECG] [EKG]: Secondary | ICD-10-CM | POA: Diagnosis not present

## 2023-01-22 DIAGNOSIS — I10 Essential (primary) hypertension: Secondary | ICD-10-CM | POA: Diagnosis not present

## 2023-01-22 DIAGNOSIS — Z01818 Encounter for other preprocedural examination: Secondary | ICD-10-CM | POA: Diagnosis present

## 2023-01-22 HISTORY — DX: Peripheral vascular disease, unspecified: I73.9

## 2023-01-22 HISTORY — DX: Other complications of anesthesia, initial encounter: T88.59XA

## 2023-01-22 HISTORY — DX: Other specified postprocedural states: R11.2

## 2023-01-22 HISTORY — DX: Unspecified osteoarthritis, unspecified site: M19.90

## 2023-01-22 HISTORY — DX: Other specified postprocedural states: Z98.890

## 2023-01-22 LAB — SURGICAL PCR SCREEN
MRSA, PCR: NEGATIVE
Staphylococcus aureus: NEGATIVE

## 2023-01-22 LAB — BASIC METABOLIC PANEL
Anion gap: 9 (ref 5–15)
BUN: 18 mg/dL (ref 6–20)
CO2: 24 mmol/L (ref 22–32)
Calcium: 9.5 mg/dL (ref 8.9–10.3)
Chloride: 105 mmol/L (ref 98–111)
Creatinine, Ser: 1.05 mg/dL — ABNORMAL HIGH (ref 0.44–1.00)
GFR, Estimated: >60 mL/min (ref >60)
Glucose, Bld: 106 mg/dL — ABNORMAL HIGH (ref 70–99)
Potassium: 4.4 mmol/L (ref 3.5–5.1)
Sodium: 138 mmol/L (ref 135–145)

## 2023-01-22 LAB — CBC
HCT: 38.6 % (ref 36.0–46.0)
Hemoglobin: 12.2 g/dL (ref 12.0–15.0)
MCH: 29.6 pg (ref 26.0–34.0)
MCHC: 31.6 g/dL (ref 30.0–36.0)
MCV: 93.7 fL (ref 80.0–100.0)
Platelets: 283 10*3/uL (ref 150–400)
RBC: 4.12 MIL/uL (ref 3.87–5.11)
RDW: 16 % — ABNORMAL HIGH (ref 11.5–15.5)
WBC: 5.4 10*3/uL (ref 4.0–10.5)
nRBC: 0 % (ref 0.0–0.2)

## 2023-01-22 NOTE — Progress Notes (Signed)
 For Anesthesia: PCP - Corlis Longs, FNP  Cardiologist - [facility]  Pulmonologist:Javaid, Gregory, MD [facility]   Bowel Prep reminder:  Chest x-ray -  EKG - 01/22/23 Stress Test -  ECHO - 05/08/22: Cardiac Cath -  Pacemaker/ICD device last checked: Pacemaker orders received: Device Rep notified:  Spinal Cord Stimulator:N/A  Sleep Study - N/A CPAP -   Fasting Blood Sugar - N/A Checks Blood Sugar _____ times a day Date and result of last Hgb A1c-  Last dose of GLP1 agonist- N/A GLP1 instructions:   Last dose of SGLT-2 inhibitors- N/A SGLT-2 instructions:   Blood Thinner Instructions: Eliquis  will be hold after: 01/24/23 Aspirin Instructions: Last Dose:  Activity level: Can go up a flight of stairs and activities of daily living without stopping and without chest pain and/or shortness of breath   Able to exercise without chest pain and/or shortness of breath  Anesthesia review: Hx: DVT,Clotting disorder,PE,IVC filter schedule for: 01/26/23  Patient denies shortness of breath, fever, cough and chest pain at PAT appointment   Patient verbalized understanding of instructions that were given to them at the PAT appointment. Patient was also instructed that they will need to review over the PAT instructions again at home before surgery.

## 2023-01-23 NOTE — Progress Notes (Addendum)
 Anesthesia Chart Review   Case: [id number] Date/Time: 01/29/23 1305   Procedure: COMPUTER ASSISTED TOTAL KNEE ARTHROPLASTY (Left: Knee) - 160   Anesthesia type: Spinal   Pre-op diagnosis: Right knee osteoarthritis   Location: [facility] ROOM 08 / [facility]   Surgeons: Fidel Rogue, MD       DISCUSSION:62 y.o. former smoker with h/o PONV, DVT/PE, right knee OA scheduled for above procedure 01/29/2023 with Dr. Rogue Fidel.   Pt seen by pulmonology 05/05/22 for evaluation of chronic cough. Echo ordered. Echo 05/08/2022 with EF 60%, no valvular problems.    Clearance received from pulmonology which states pt is moderate risk and can hold Eliquis  2 days prior to procedure.   Per hematology note 01/08/2023, Patient has a history of a pulmonary embolism and DVT and will be at risk when she has her second replacement to have another DVT and pulmonary embolism and I would bridge her with Lovenox while she is off of her Eliquis .   Pt will have a IVC filter placed prior to knee surgery. Last dose of Eliquis  per patient 1/11.   VS: LMP 07/27/2017   PROVIDERS: Corlis Longs, FNP is PCP    LABS: Labs reviewed: Acceptable for surgery. (all labs ordered are listed, but only abnormal results are displayed)  Labs Reviewed - No data to display   IMAGES:   EKG:   CV: Echo 05/08/2022 Left Ventricle: Systolic function is normal. EF: 60%.    Left Ventricle: Wall motion is normal.    Left Ventricle: Doppler parameters indicate normal diastolic function.    Tricuspid Valve: The right ventricular systolic pressure is normal (<36  mmHg).   Aorta: The aortic root is normal in size. The ascending aorta is normal  in size.  Aortic arch was not well-visualized but it is probably  borderline dilated measuring about 3.2 cm.    Aortic Valve: The aortic valve is tricuspid. The left and right  leaflets are not thickened and exhibit normal excursion.  Noncoronary cusp  appears slightly thickened but exhibit  normal excursion.  Past Medical History:  Diagnosis Date   Arthritis    Clotting disorder (HCC)    Complication of anesthesia    DVT (deep venous thrombosis) (HCC)    Ectopic pregnancy    Fibroids    Hyperlipidemia    PE (pulmonary embolism)    Peripheral vascular disease (HCC)    Pneumonia    PONV (postoperative nausea and vomiting)    Umbilical hernia     Past Surgical History:  Procedure Laterality Date   DIAGNOSTIC LAPAROSCOPY  2007,2012   ectopic preg   EYE SURGERY     cataract   KNEE ARTHROPLASTY Right 12/13/2020   Procedure: COMPUTER ASSISTED TOTAL KNEE ARTHROPLASTY;  Surgeon: Fidel Rogue, MD;  Location[facility];  Service: Orthopedics;  Laterality: Right;   KNEE SURGERY  01/13/1998   right/ scope   macular degeneration Bilateral    UMBILICAL HERNIA REPAIR N/A 08/24/2013   Procedure: OPEN REPAIR OF INCISIONAL UMBILCAL HERNIA;  Surgeon: Elon CHRISTELLA Pacini, MD;  Location: [facility];  Service: General;  Laterality: N/A;   WISDOM TOOTH EXTRACTION      MEDICATIONS: No current facility-administered medications for this encounter.    acetaminophen  (TYLENOL ) 325 MG tablet   apixaban  (ELIQUIS ) 5 MG TABS tablet   DM-Doxylamine-Acetaminophen  (NYQUIL COLD & FLU PO)   tobramycin  (TOBREX ) 0.3 % ophthalmic solution      Harlene Hoots Ward, PA-C [facility] Pre-Surgical Testing [phone]

## 2023-01-26 ENCOUNTER — Ambulatory Visit (HOSPITAL_COMMUNITY)
Admission: RE | Admit: 2023-01-26 | Discharge: 2023-01-26 | Disposition: A | Discharge status: Home / Self Care | Payer: BC Managed Care – PPO | Source: Ambulatory Visit | Attending: Vascular Surgery | Admitting: Vascular Surgery

## 2023-01-26 ENCOUNTER — Encounter (HOSPITAL_COMMUNITY): Payer: Self-pay | Admitting: Physician Assistant

## 2023-01-26 ENCOUNTER — Other Ambulatory Visit: Payer: Self-pay

## 2023-01-26 ENCOUNTER — Encounter (HOSPITAL_COMMUNITY)
Admission: RE | Disposition: A | Discharge status: Home / Self Care | Payer: Self-pay | Source: Ambulatory Visit | Attending: Vascular Surgery

## 2023-01-26 DIAGNOSIS — Z86711 Personal history of pulmonary embolism: Secondary | ICD-10-CM | POA: Diagnosis not present

## 2023-01-26 DIAGNOSIS — Z408 Encounter for other prophylactic surgery: Secondary | ICD-10-CM | POA: Diagnosis present

## 2023-01-26 DIAGNOSIS — M25562 Pain in left knee: Secondary | ICD-10-CM | POA: Diagnosis not present

## 2023-01-26 DIAGNOSIS — Z7901 Long term (current) use of anticoagulants: Secondary | ICD-10-CM | POA: Insufficient documentation

## 2023-01-26 DIAGNOSIS — Z86718 Personal history of other venous thrombosis and embolism: Secondary | ICD-10-CM | POA: Diagnosis not present

## 2023-01-26 DIAGNOSIS — M1712 Unilateral primary osteoarthritis, left knee: Secondary | ICD-10-CM | POA: Insufficient documentation

## 2023-01-26 HISTORY — PX: IVC FILTER INSERTION: CATH118245

## 2023-01-26 SURGERY — IVC FILTER INSERTION
Anesthesia: LOCAL

## 2023-01-26 MED ORDER — IODIXANOL 320 MG/ML IV SOLN
INTRAVENOUS | Status: DC | PRN
  Administered 2023-01-26: 10 mL

## 2023-01-26 MED ORDER — MIDAZOLAM HCL 2 MG/2ML IJ SOLN
INTRAMUSCULAR | Status: DC | PRN
  Administered 2023-01-26: 1 mg via INTRAVENOUS

## 2023-01-26 MED ORDER — MIDAZOLAM HCL 2 MG/2ML IJ SOLN
INTRAMUSCULAR | Status: AC
  Filled 2023-01-26: qty 2

## 2023-01-26 MED ORDER — LIDOCAINE HCL (PF) 1 % IJ SOLN
INTRAMUSCULAR | Status: AC
  Filled 2023-01-26: qty 30

## 2023-01-26 MED ORDER — FENTANYL CITRATE (PF) 100 MCG/2ML IJ SOLN
INTRAMUSCULAR | Status: AC
  Filled 2023-01-26: qty 2

## 2023-01-26 MED ORDER — LIDOCAINE HCL (PF) 1 % IJ SOLN
INTRAMUSCULAR | Status: DC | PRN
  Administered 2023-01-26: 15 mL via INTRADERMAL

## 2023-01-26 MED ORDER — SODIUM CHLORIDE 0.9 % IV SOLN: INTRAVENOUS | Status: DC

## 2023-01-26 MED ORDER — HEPARIN (PORCINE) IN NACL 1000-0.9 UT/500ML-% IV SOLN
INTRAVENOUS | Status: DC | PRN
  Administered 2023-01-26: 500 mL

## 2023-01-26 MED ORDER — FENTANYL CITRATE (PF) 100 MCG/2ML IJ SOLN
INTRAMUSCULAR | Status: DC | PRN
  Administered 2023-01-26: 25 ug via INTRAVENOUS

## 2023-01-26 SURGICAL SUPPLY — 8 items
FILTER VC CELECT-FEMORAL (Filter) IMPLANT
KIT MICROPUNCTURE NIT STIFF (SHEATH) IMPLANT
KIT PV (KITS) ×1 IMPLANT
SHEATH PROBE COVER 6X72 (BAG) IMPLANT
SYR MEDRAD MARK 7 150ML (SYRINGE) ×1 IMPLANT
TRANSDUCER W/STOPCOCK (MISCELLANEOUS) ×1 IMPLANT
TRAY PV CATH (CUSTOM PROCEDURE TRAY) ×1 IMPLANT
WIRE BENTSON .035X145CM (WIRE) IMPLANT

## 2023-01-26 NOTE — Op Note (Signed)
    Patient name: [name] MRN: [id number] DOB: 1960-05-06 Sex: female  01/26/2023 Pre-operative Diagnosis: Extensive thromboembolic history with plans for left total knee arthroplasty Post-operative diagnosis:  Same Surgeon:  Penne BROCKS. Sheree, MD Procedure Performed: 1.  Ultrasound-guided cannulation right common femoral vein 2.  IVC venogram 3.  Placement of infrarenal Cook Celect IVC filter 4.  Moderate sedation with fentanyl  and Versed  for 9 minutes  Indications: 63 year old female with a history of multiple previous clotting issues with a strong family history of clotting disorders.  She is now planned for left total knee arthroplasty and we have discussed placing filter while she is off of her anticoagulation which has been held since Friday.  We have discussed the risk benefits alternatives as well as need for removal and she demonstrates good understanding.  Findings: Right common femoral vein and IVC was patent the renal veins were easily identified and the filter was placed just below the level of the renal veins.   Procedure:  The patient was identified in the holding area and taken to room 8.  The patient was then placed supine on the table and prepped and draped in the usual sterile fashion.  A time out was called.  Ultrasound was used to evaluate the right common femoral vein which was patent and compressible.  The area was anesthetized with 1% lidocaine .  Concomitantly we administered fentanyl  and Versed  as moderate sedation her vital signs were monitored throughout the case.  We cannulated the common femoral vein with micropuncture needle followed by wire and a sheath.  A Bentson wire was placed and we dilated with the sheath dilator and placed the filter introducer sheath.  We then performed IVC venogram through the sheath and then marked the renal veins on the screen.  The filter was then deployed just below this.  The introducer was removed and pressure held hemostasis  obtained.  The patient tolerated the procedure without any complication.  Contrast: 10 cc   Taiwan Talcott C. Sheree, MD [facility] Office: [phone] Pager: [phone]

## 2023-01-26 NOTE — H&P (Signed)
 H+P   History of Present Illness: This is a 63 y.o. female with history of significant DVTs including multiple PEs she is maintained on Eliquis .  She does not have a known clotting disorder but there are 5 members of her family with significant clotting history.  She stopped Eliquis  last Friday.  She has plan for left total knee arthroplasty this Thursday has had previous right total knee arthroplasty in the past from which she has healed well.  Past Medical History:  Diagnosis Date   Arthritis    Clotting disorder (HCC)    Complication of anesthesia    DVT (deep venous thrombosis) (HCC)    Ectopic pregnancy    Fibroids    Hyperlipidemia    PE (pulmonary embolism)    Peripheral vascular disease (HCC)    Pneumonia    PONV (postoperative nausea and vomiting)    Umbilical hernia     Past Surgical History:  Procedure Laterality Date   DIAGNOSTIC LAPAROSCOPY  2007,2012   ectopic preg   EYE SURGERY     cataract   KNEE ARTHROPLASTY Right 12/13/2020   Procedure: COMPUTER ASSISTED TOTAL KNEE ARTHROPLASTY;  Surgeon: Fidel Rogue, MD;  [facility];  Service: Orthopedics;  Laterality: Right;   KNEE SURGERY  01/13/1998   right/ scope   macular degeneration Bilateral    UMBILICAL HERNIA REPAIR N/A 08/24/2013   Procedure: OPEN REPAIR OF INCISIONAL UMBILCAL HERNIA;  Surgeon: Elon CHRISTELLA Pacini, MD;  Location: [facility];  Service: General;  Laterality: N/A;   WISDOM TOOTH EXTRACTION      Allergies  Allergen Reactions   Pregabalin Shortness Of Breath   Tramadol Nausea Only    Prior to Admission medications   Medication Sig Start Date End Date Taking? Authorizing Provider  acetaminophen  (TYLENOL ) 325 MG tablet Take 1-2 tablets (325-650 mg total) by mouth every 6 (six) hours as needed for mild pain (pain score 1-3 or temp > 100.5). 12/14/20   Logan Ubaldo NOVAK, PA-C  apixaban  (ELIQUIS ) 5 MG TABS tablet Take 1 tablet (5 mg total) by mouth every 12 (twelve) hours.  12/14/20 01/19/23  Swinteck, Rogue, MD  DM-Doxylamine-Acetaminophen  (NYQUIL COLD & FLU PO) Take 1 Capful by mouth every 2 (two) hours as needed (cold).    [provider]  tobramycin  (TOBREX ) 0.3 % ophthalmic solution Place 1 drop into the right eye 4 (four) times daily.    [provider]    Social History   Socioeconomic History   Marital status: Single    Spouse name: Not on file   Number of children: Not on file   Years of education: Not on file   Highest education level: Not on file  Occupational History   Not on file  Tobacco Use   Smoking status: Former    Current packs/day: 0.00    Types: Cigarettes    Quit date: 08/09/2003    Years since quitting: 19.4   Smokeless tobacco: Never  Vaping Use   Vaping status: Never Used  Substance and Sexual Activity   Alcohol  use: Not Currently    Comment: rare   Drug use: No   Sexual activity: Not on file  Other Topics Concern   Not on file  Social History Narrative   Not on file   Social Drivers of Health   Financial Resource Strain: Low Risk  (01/16/2022)   Received [facility]   Overall Financial Resource Strain (CARDIA)    Difficulty of Paying Living Expenses: Not  hard at all  Food Insecurity: No Food Insecurity (01/16/2022)   Received from [facility]   Hunger Vital Sign    Worried About Running Out of Food in the Last Year: Never true    Ran Out of Food in the Last Year: Never true  Transportation Needs: No Transportation Needs (01/16/2022)   Received from [facility] - Transportation    Lack of Transportation (Medical): No    Lack of Transportation (Non-Medical): No  Physical Activity: Not on file  Stress: Not on file  Social Connections: Unknown (06/19/2022)   Received from [facility]   Social Network    Social Network: Not on file  Intimate Partner Violence: Unknown (06/19/2022)   Received from [facility]   HITS    Physically Hurt: Not on file    Insult or Talk Down To: Not  on file    Threaten Physical Harm: Not on file    Scream or Curse: Not on file     Family History  Problem Relation Age of Onset   Rheum arthritis Mother    Cancer Father     ROS: Left knee pain  Physical Examination  Vitals:   01/26/23 0554  BP: (!) 146/76  Pulse: 64  Resp: 18  Temp: 97.8 F (36.6 C)  SpO2: 99%   Body mass index is 40.15 kg/m.  Awake alert oriented Nonlabored respirations Bilateral lower extremities without edema  CBC    Component Value Date/Time   WBC 5.4 01/22/2023 1438   RBC 4.12 01/22/2023 1438   HGB 12.2 01/22/2023 1438   HGB 12.3 11/26/2009 1513   HCT 38.6 01/22/2023 1438   HCT 36.6 11/26/2009 1513   PLT 283 01/22/2023 1438   PLT 196 11/26/2009 1513   MCV 93.7 01/22/2023 1438   MCV 93 11/26/2009 1513   MCH 29.6 01/22/2023 1438   MCHC 31.6 01/22/2023 1438   RDW 16.0 (H) 01/22/2023 1438   RDW 11.5 11/26/2009 1513   LYMPHSABS 1.9 08/23/2013 1410   LYMPHSABS 2.1 11/26/2009 1513   MONOABS 0.3 08/23/2013 1410   EOSABS 0.1 08/23/2013 1410   EOSABS 0.2 11/26/2009 1513   BASOSABS 0.0 08/23/2013 1410   BASOSABS 0.0 11/26/2009 1513    BMET    Component Value Date/Time   NA 138 01/22/2023 1438   K 4.4 01/22/2023 1438   CL 105 01/22/2023 1438   CO2 24 01/22/2023 1438   GLUCOSE 106 (H) 01/22/2023 1438   BUN 18 01/22/2023 1438   CREATININE 1.05 (H) 01/22/2023 1438   CALCIUM 9.5 01/22/2023 1438   GFRNONAA >60 01/22/2023 1438   GFRAA 81 (L) 08/23/2013 1410    COAGS: Lab Results  Component Value Date   INR 1.0 11/30/2020   INR 1.7 (H) 01/26/2020   INR 1.36 08/23/2013   PROTIME 13.5 07/03/2008      ASSESSMENT/PLAN: This is a 63 y.o. female with plans for left total knee arthroplasty and significant history of clotting currently maintained on Eliquis  which has been held since Friday.  Plan will be to place filter today hold anticoagulation until safe to resume postoperatively.  I have discussed removing the filter at 3 months as  long as she has complete he healed from surgery without plans for additional orthopedic procedures.  We discussed the risk benefits and alternatives of placing a filter and she demonstrates good understanding and consent was signed.   Shaheer Bonfield C. Sheree, MD [facility] Office: [phone] Pager: [phone]

## 2023-01-27 ENCOUNTER — Encounter (HOSPITAL_COMMUNITY): Payer: Self-pay | Admitting: Vascular Surgery

## 2023-01-27 ENCOUNTER — Ambulatory Visit: Payer: Self-pay | Admitting: Student

## 2023-01-27 NOTE — H&P (Signed)
 TOTAL KNEE ADMISSION H&P  Patient is being admitted for left total knee arthroplasty.  Subjective:  Chief Complaint:left knee pain.  HPI: [name], 63 y.o. female, has a history of pain and functional disability in the left knee due to arthritis and has failed non-surgical conservative treatments for greater than 12 weeks to includeNSAID's and/or analgesics, corticosteriod injections, viscosupplementation injections, flexibility and strengthening excercises, use of assistive devices, weight reduction as appropriate, and activity modification.  Onset of symptoms was gradual, starting 10 years ago with rapidlly worsening course since that time. The patient noted no past surgery on the left knee(s).  Patient currently rates pain in the left knee(s) at 10 out of 10 with activity. Patient has night pain, worsening of pain with activity and weight bearing, pain that interferes with activities of daily living, and pain with passive range of motion.  Patient has evidence of subchondral cysts, subchondral sclerosis, periarticular osteophytes, and joint space narrowing by imaging studies. There is no active infection.  Patient Active Problem List   Diagnosis Date Noted   Degenerative arthritis of right knee 12/13/2020   Osteoarthritis of right knee 12/13/2020   Incisional hernia, without obstruction or gangrene 08/08/2013   Disorder of skin or subcutaneous tissue 11/05/2009   INSOMNIA, PERSISTENT 11/06/2006   Allergic rhinitis 11/06/2006   OBESITY NOS 09/30/2006   Anxiety state 09/30/2006   Essential hypertension 09/30/2006   PE 09/05/2006   Past Medical History:  Diagnosis Date   Arthritis    Clotting disorder (HCC)    Complication of anesthesia    DVT (deep venous thrombosis) (HCC)    Ectopic pregnancy    Fibroids    Hyperlipidemia    PE (pulmonary embolism)    Peripheral vascular disease (HCC)    Pneumonia    PONV (postoperative nausea and vomiting)    Umbilical hernia     Past  Surgical History:  Procedure Laterality Date   DIAGNOSTIC LAPAROSCOPY  2007,2012   ectopic preg   EYE SURGERY     cataract   IVC FILTER INSERTION N/A 01/26/2023   Procedure: IVC FILTER INSERTION;  Surgeon: Sheree Penne Bruckner, MD;  Location: [facility] INVASIVE CV LAB;  Service: Cardiovascular;  Laterality: N/A;   KNEE ARTHROPLASTY Right 12/13/2020   Procedure: COMPUTER ASSISTED TOTAL KNEE ARTHROPLASTY;  Surgeon: Fidel Rogue, MD;  Location: [facility];  Service: Orthopedics;  Laterality: Right;   KNEE SURGERY  01/13/1998   right/ scope   macular degeneration Bilateral    UMBILICAL HERNIA REPAIR N/A 08/24/2013   Procedure: OPEN REPAIR OF INCISIONAL UMBILCAL HERNIA;  Surgeon: Elon CHRISTELLA Pacini, MD;  Location: [facility];  Service: General;  Laterality: N/A;   WISDOM TOOTH EXTRACTION      Current Outpatient Medications  Medication Sig Dispense Refill Last Dose/Taking   acetaminophen  (TYLENOL ) 325 MG tablet Take 1-2 tablets (325-650 mg total) by mouth every 6 (six) hours as needed for mild pain (pain score 1-3 or temp > 100.5).      DM-Doxylamine-Acetaminophen  (NYQUIL COLD & FLU PO) Take 1 Capful by mouth every 2 (two) hours as needed (cold).      tobramycin  (TOBREX ) 0.3 % ophthalmic solution Place 1 drop into the right eye 4 (four) times daily.      No current facility-administered medications for this visit.   Allergies  Allergen Reactions   Pregabalin Shortness Of Breath   Tramadol Nausea Only    Social History   Tobacco Use   Smoking status: Former  Current packs/day: 0.00    Types: Cigarettes    Quit date: 08/09/2003    Years since quitting: 19.4   Smokeless tobacco: Never  Substance Use Topics   Alcohol  use: Not Currently    Comment: rare    Family History  Problem Relation Age of Onset   Rheum arthritis Mother    Cancer Father      Review of Systems  Musculoskeletal:  Positive for arthralgias, gait problem and joint swelling.  All other systems  reviewed and are negative.   Objective:  Physical Exam Constitutional:      Appearance: Normal appearance.  HENT:     Head: Normocephalic and atraumatic.     Nose: Nose normal.     Mouth/Throat:     Mouth: Mucous membranes are moist.     Pharynx: Oropharynx is clear.  Eyes:     Conjunctiva/sclera: Conjunctivae normal.  Cardiovascular:     Rate and Rhythm: Normal rate and regular rhythm.     Pulses: Normal pulses.     Heart sounds: Normal heart sounds.  Pulmonary:     Effort: Pulmonary effort is normal.     Breath sounds: Normal breath sounds.  Abdominal:     General: Abdomen is flat.     Palpations: Abdomen is soft.  Genitourinary:    Comments: Deferred. Musculoskeletal:     Cervical back: Normal range of motion and neck supple.     Comments: Examination of the left knee reveals no skin wounds or lesions. She has some mild soft tissue swelling. No erythema or effusion. No warmth. Tenderness to palpation medial joint line and peripatellar retinacular tissues with positive grind sign. Range of motion 0 to 110 degrees without any instability. Pain over the pes anserine insertion.   She is neurovascularly intact distally  She has a severely antalgic gait and ambulates with a cane.   Skin:    General: Skin is warm and dry.     Capillary Refill: Capillary refill takes less than 2 seconds.  Neurological:     General: No focal deficit present.     Mental Status: She is alert and oriented to person, place, and time.  Psychiatric:        Mood and Affect: Mood normal.        Behavior: Behavior normal.        Thought Content: Thought content normal.        Judgment: Judgment normal.     Vital signs in last 24 hours: @VSRANGES @  Labs:   Estimated body mass index is 40.15 kg/m as calculated from the following:   Height as of 01/26/23: 5' 5.5 (1.664 m).   Weight as of 01/26/23: 111.1 kg.   Imaging Review Plain radiographs demonstrate severe degenerative joint disease of  the left knee(s). The overall alignment issignificant varus. The bone quality appears to be adequate for age and reported activity level.      Assessment/Plan:  End stage arthritis, left knee   The patient history, physical examination, clinical judgment of the provider and imaging studies are consistent with end stage degenerative joint disease of the left knee(s) and total knee arthroplasty is deemed medically necessary. The treatment options including medical management, injection therapy arthroscopy and arthroplasty were discussed at length. The risks and benefits of total knee arthroplasty were presented and reviewed. The risks due to aseptic loosening, infection, stiffness, patella tracking problems, thromboembolic complications and other imponderables were discussed. The patient acknowledged the explanation, agreed to proceed with the plan  and consent was signed. Patient is being admitted for inpatient treatment for surgery, pain control, PT, OT, prophylactic antibiotics, VTE prophylaxis, progressive ambulation and ADL's and discharge planning. The patient is planning to be discharged home with OPPT after an overnight stay.  Therapy Plans: outpatient therapy. PT at [facility] 02/02/23.  Disposition: Home with sisters Planned DVT Prophylaxis: Eliquis  5mg  BID DME needed: Has rolling walker. Have ice machine.  PCP: Cleared. Hold Eliquis  2 days prior to surgery. Hematology: Hold eliquis  2 days prior to surgery, recommend bridge with lovenox during bridge interval.  TXA: IV Allergies:  - Lyrica - feels like heart attack. - Tramadol - feel high Anesthesia Concerns: Some issues with nausea.  BMI: 41.3 Last HgbA1c: Not diabetic. Other: - History PE and DVT. Strong family history clots. On Eliquis  5mg  BID baseline. - IVC filter Monday 01/26/23. - Oxycodone , zofran , methocarbamol .  - 01/22/23: Hgb 12.2, K+ 4.4, Cr. 1.05.     Patient's anticipated LOS is less than 2 midnights,  meeting these requirements: - Younger than 45 - Lives within 1 hour of care - Has a competent adult at home to recover with post-op recover - NO history of  - Chronic pain requiring opiods  - Diabetes  - Coronary Artery Disease  - Heart failure  - Heart attack  - Stroke  - DVT/VTE  - Cardiac arrhythmia  - Respiratory Failure/COPD  - Renal failure  - Anemia  - Advanced Liver disease

## 2023-01-27 NOTE — H&P (View-Only) (Signed)
 TOTAL KNEE ADMISSION H&P  Patient is being admitted for left total knee arthroplasty.  Subjective:  Chief Complaint:left knee pain.  HPI: [name], 63 y.o. female, has a history of pain and functional disability in the left knee due to arthritis and has failed non-surgical conservative treatments for greater than 12 weeks to includeNSAID's and/or analgesics, corticosteriod injections, viscosupplementation injections, flexibility and strengthening excercises, use of assistive devices, weight reduction as appropriate, and activity modification.  Onset of symptoms was gradual, starting 10 years ago with rapidlly worsening course since that time. The patient noted no past surgery on the left knee(s).  Patient currently rates pain in the left knee(s) at 10 out of 10 with activity. Patient has night pain, worsening of pain with activity and weight bearing, pain that interferes with activities of daily living, and pain with passive range of motion.  Patient has evidence of subchondral cysts, subchondral sclerosis, periarticular osteophytes, and joint space narrowing by imaging studies. There is no active infection.  Patient Active Problem List   Diagnosis Date Noted   Degenerative arthritis of right knee 12/13/2020   Osteoarthritis of right knee 12/13/2020   Incisional hernia, without obstruction or gangrene 08/08/2013   Disorder of skin or subcutaneous tissue 11/05/2009   INSOMNIA, PERSISTENT 11/06/2006   Allergic rhinitis 11/06/2006   OBESITY NOS 09/30/2006   Anxiety state 09/30/2006   Essential hypertension 09/30/2006   PE 09/05/2006   Past Medical History:  Diagnosis Date   Arthritis    Clotting disorder (HCC)    Complication of anesthesia    DVT (deep venous thrombosis) (HCC)    Ectopic pregnancy    Fibroids    Hyperlipidemia    PE (pulmonary embolism)    Peripheral vascular disease (HCC)    Pneumonia    PONV (postoperative nausea and vomiting)    Umbilical hernia     Past  Surgical History:  Procedure Laterality Date   DIAGNOSTIC LAPAROSCOPY  2007,2012   ectopic preg   EYE SURGERY     cataract   IVC FILTER INSERTION N/A 01/26/2023   Procedure: IVC FILTER INSERTION;  Surgeon: Maeola Harman, MD;  Location: [facility] INVASIVE CV LAB;  Service: Cardiovascular;  Laterality: N/A;   KNEE ARTHROPLASTY Right 12/13/2020   Procedure: COMPUTER ASSISTED TOTAL KNEE ARTHROPLASTY;  Surgeon: Samson Frederic, MD;  Location: [facility];  Service: Orthopedics;  Laterality: Right;   KNEE SURGERY  01/13/1998   right/ scope   macular degeneration Bilateral    UMBILICAL HERNIA REPAIR N/A 08/24/2013   Procedure: OPEN REPAIR OF INCISIONAL UMBILCAL HERNIA;  Surgeon: Ernestene Mention, MD;  Location: [facility];  Service: General;  Laterality: N/A;   WISDOM TOOTH EXTRACTION      Current Outpatient Medications  Medication Sig Dispense Refill Last Dose/Taking   acetaminophen (TYLENOL) 325 MG tablet Take 1-2 tablets (325-650 mg total) by mouth every 6 (six) hours as needed for mild pain (pain score 1-3 or temp > 100.5).      DM-Doxylamine-Acetaminophen (NYQUIL COLD & FLU PO) Take 1 Capful by mouth every 2 (two) hours as needed (cold).      tobramycin (TOBREX) 0.3 % ophthalmic solution Place 1 drop into the right eye 4 (four) times daily.      No current facility-administered medications for this visit.   Allergies  Allergen Reactions   Pregabalin Shortness Of Breath   Tramadol Nausea Only    Social History   Tobacco Use   Smoking status: Former  Current packs/day: 0.00    Types: Cigarettes    Quit date: 08/09/2003    Years since quitting: 19.4   Smokeless tobacco: Never  Substance Use Topics   Alcohol  use: Not Currently    Comment: rare    Family History  Problem Relation Age of Onset   Rheum arthritis Mother    Cancer Father      Review of Systems  Musculoskeletal:  Positive for arthralgias, gait problem and joint swelling.  All other systems  reviewed and are negative.   Objective:  Physical Exam Constitutional:      Appearance: Normal appearance.  HENT:     Head: Normocephalic and atraumatic.     Nose: Nose normal.     Mouth/Throat:     Mouth: Mucous membranes are moist.     Pharynx: Oropharynx is clear.  Eyes:     Conjunctiva/sclera: Conjunctivae normal.  Cardiovascular:     Rate and Rhythm: Normal rate and regular rhythm.     Pulses: Normal pulses.     Heart sounds: Normal heart sounds.  Pulmonary:     Effort: Pulmonary effort is normal.     Breath sounds: Normal breath sounds.  Abdominal:     General: Abdomen is flat.     Palpations: Abdomen is soft.  Genitourinary:    Comments: Deferred. Musculoskeletal:     Cervical back: Normal range of motion and neck supple.     Comments: Examination of the left knee reveals no skin wounds or lesions. She has some mild soft tissue swelling. No erythema or effusion. No warmth. Tenderness to palpation medial joint line and peripatellar retinacular tissues with positive grind sign. Range of motion 0 to 110 degrees without any instability. Pain over the pes anserine insertion.   She is neurovascularly intact distally  She has a severely antalgic gait and ambulates with a cane.   Skin:    General: Skin is warm and dry.     Capillary Refill: Capillary refill takes less than 2 seconds.  Neurological:     General: No focal deficit present.     Mental Status: She is alert and oriented to person, place, and time.  Psychiatric:        Mood and Affect: Mood normal.        Behavior: Behavior normal.        Thought Content: Thought content normal.        Judgment: Judgment normal.     Vital signs in last 24 hours: @VSRANGES @  Labs:   Estimated body mass index is 40.15 kg/m as calculated from the following:   Height as of 01/26/23: 5' 5.5 (1.664 m).   Weight as of 01/26/23: 111.1 kg.   Imaging Review Plain radiographs demonstrate severe degenerative joint disease of  the left knee(s). The overall alignment issignificant varus. The bone quality appears to be adequate for age and reported activity level.      Assessment/Plan:  End stage arthritis, left knee   The patient history, physical examination, clinical judgment of the provider and imaging studies are consistent with end stage degenerative joint disease of the left knee(s) and total knee arthroplasty is deemed medically necessary. The treatment options including medical management, injection therapy arthroscopy and arthroplasty were discussed at length. The risks and benefits of total knee arthroplasty were presented and reviewed. The risks due to aseptic loosening, infection, stiffness, patella tracking problems, thromboembolic complications and other imponderables were discussed. The patient acknowledged the explanation, agreed to proceed with the plan  and consent was signed. Patient is being admitted for inpatient treatment for surgery, pain control, PT, OT, prophylactic antibiotics, VTE prophylaxis, progressive ambulation and ADL's and discharge planning. The patient is planning to be discharged home with OPPT after an overnight stay.  Therapy Plans: outpatient therapy. PT at [facility] center 02/02/23.  Disposition: Home with sisters Planned DVT Prophylaxis: Eliquis 5mg  BID DME needed: Has rolling walker. Have ice machine.  PCP: Cleared. Hold Eliquis 2 days prior to surgery. Hematology: Hold eliquis 2 days prior to surgery, recommend bridge with lovenox during bridge interval.  TXA: IV Allergies:  - Lyrica - feels like heart attack. - Tramadol - "feel high" Anesthesia Concerns: Some issues with nausea.  BMI: 41.3 Last HgbA1c: Not diabetic. Other: - History PE and DVT. Strong family history clots. On Eliquis 5mg  BID baseline. - IVC filter Monday 01/26/23. - Oxycodone, zofran, methocarbamol.  - 01/22/23: Hgb 12.2, K+ 4.4, Cr. 1.05.     Patient's anticipated LOS is less than 2 midnights,  meeting these requirements: - Younger than 108 - Lives within 1 hour of care - Has a competent adult at home to recover with post-op recover - NO history of  - Chronic pain requiring opiods  - Diabetes  - Coronary Artery Disease  - Heart failure  - Heart attack  - Stroke  - DVT/VTE  - Cardiac arrhythmia  - Respiratory Failure/COPD  - Renal failure  - Anemia  - Advanced Liver disease

## 2023-01-29 ENCOUNTER — Ambulatory Visit (HOSPITAL_COMMUNITY): Payer: Self-pay | Admitting: Physician Assistant

## 2023-01-29 ENCOUNTER — Ambulatory Visit (HOSPITAL_COMMUNITY)
Admission: RE | Admit: 2023-01-29 | Discharge: 2023-01-31 | Disposition: A | Discharge status: Home / Self Care | Payer: BC Managed Care – PPO | Source: Ambulatory Visit | Attending: Orthopedic Surgery | Admitting: Orthopedic Surgery

## 2023-01-29 ENCOUNTER — Encounter (HOSPITAL_COMMUNITY)
Admission: RE | Disposition: A | Discharge status: Home / Self Care | Payer: Self-pay | Source: Ambulatory Visit | Attending: Orthopedic Surgery

## 2023-01-29 ENCOUNTER — Other Ambulatory Visit: Payer: Self-pay

## 2023-01-29 ENCOUNTER — Ambulatory Visit (HOSPITAL_COMMUNITY): Payer: BC Managed Care – PPO

## 2023-01-29 ENCOUNTER — Encounter (HOSPITAL_COMMUNITY): Payer: Self-pay | Admitting: Orthopedic Surgery

## 2023-01-29 DIAGNOSIS — Z86718 Personal history of other venous thrombosis and embolism: Secondary | ICD-10-CM | POA: Diagnosis not present

## 2023-01-29 DIAGNOSIS — Z6841 Body Mass Index (BMI) 40.0 and over, adult: Secondary | ICD-10-CM | POA: Diagnosis not present

## 2023-01-29 DIAGNOSIS — M170 Bilateral primary osteoarthritis of knee: Secondary | ICD-10-CM | POA: Insufficient documentation

## 2023-01-29 DIAGNOSIS — Z7901 Long term (current) use of anticoagulants: Secondary | ICD-10-CM | POA: Insufficient documentation

## 2023-01-29 DIAGNOSIS — E669 Obesity, unspecified: Secondary | ICD-10-CM | POA: Insufficient documentation

## 2023-01-29 DIAGNOSIS — Z86711 Personal history of pulmonary embolism: Secondary | ICD-10-CM | POA: Insufficient documentation

## 2023-01-29 DIAGNOSIS — Z96652 Presence of left artificial knee joint: Secondary | ICD-10-CM

## 2023-01-29 DIAGNOSIS — Z87891 Personal history of nicotine dependence: Secondary | ICD-10-CM | POA: Diagnosis not present

## 2023-01-29 HISTORY — PX: KNEE ARTHROPLASTY: SHX992

## 2023-01-29 SURGERY — ARTHROPLASTY, KNEE, TOTAL, USING IMAGELESS COMPUTER-ASSISTED NAVIGATION
Anesthesia: Monitor Anesthesia Care | Site: Knee | Laterality: Left

## 2023-01-29 MED ORDER — PROPOFOL 1000 MG/100ML IV EMUL
INTRAVENOUS | Status: AC
Start: 2023-01-29 — End: ?
  Filled 2023-01-29: qty 100

## 2023-01-29 MED ORDER — METHOCARBAMOL 1000 MG/10ML IJ SOLN: 500.0000 mg | Freq: Four times a day (QID) | INTRAMUSCULAR | Status: DC | PRN

## 2023-01-29 MED ORDER — MIDAZOLAM HCL 2 MG/2ML IJ SOLN
INTRAMUSCULAR | Status: DC | PRN
  Administered 2023-01-29 (×2): 1 mg via INTRAVENOUS

## 2023-01-29 MED ORDER — PHENOL 1.4 % MT LIQD: 1.0000 | OROMUCOSAL | Status: DC | PRN

## 2023-01-29 MED ORDER — SENNA 8.6 MG PO TABS
1.0000 | ORAL_TABLET | Freq: Two times a day (BID) | ORAL | Status: DC
  Administered 2023-01-29 – 2023-01-31 (×4): 8.6 mg via ORAL
  Filled 2023-01-29 (×4): qty 1

## 2023-01-29 MED ORDER — ONDANSETRON HCL 4 MG/2ML IJ SOLN
INTRAMUSCULAR | Status: DC | PRN
  Administered 2023-01-29: 4 mg via INTRAVENOUS

## 2023-01-29 MED ORDER — FENTANYL CITRATE PF 50 MCG/ML IJ SOSY
25.0000 ug | PREFILLED_SYRINGE | INTRAMUSCULAR | Status: DC | PRN
  Administered 2023-01-29 (×2): 50 ug via INTRAVENOUS

## 2023-01-29 MED ORDER — PROPOFOL 1000 MG/100ML IV EMUL
INTRAVENOUS | Status: AC
  Filled 2023-01-29: qty 100

## 2023-01-29 MED ORDER — ACETAMINOPHEN 500 MG PO TABS
1000.0000 mg | ORAL_TABLET | Freq: Four times a day (QID) | ORAL | Status: AC
  Administered 2023-01-29 – 2023-01-30 (×4): 1000 mg via ORAL
  Filled 2023-01-29 (×4): qty 2

## 2023-01-29 MED ORDER — MENTHOL 3 MG MT LOZG
1.0000 | LOZENGE | OROMUCOSAL | Status: DC | PRN
Start: 2023-01-29 — End: 2023-01-31

## 2023-01-29 MED ORDER — ACETAMINOPHEN 500 MG PO TABS
1000.0000 mg | ORAL_TABLET | Freq: Once | ORAL | Status: AC
  Administered 2023-01-29: 1000 mg via ORAL
  Filled 2023-01-29: qty 2

## 2023-01-29 MED ORDER — DOCUSATE SODIUM 100 MG PO CAPS
100.0000 mg | ORAL_CAPSULE | Freq: Two times a day (BID) | ORAL | Status: DC
  Administered 2023-01-29 – 2023-01-31 (×4): 100 mg via ORAL
  Filled 2023-01-29 (×4): qty 1

## 2023-01-29 MED ORDER — ACETAMINOPHEN 325 MG PO TABS
325.0000 mg | ORAL_TABLET | Freq: Four times a day (QID) | ORAL | Status: DC | PRN
  Administered 2023-01-30: 650 mg via ORAL
  Filled 2023-01-29: qty 2

## 2023-01-29 MED ORDER — TOBRAMYCIN 0.3 % OP SOLN
1.0000 [drp] | Freq: Four times a day (QID) | OPHTHALMIC | Status: DC
Start: 2023-01-29 — End: 2023-01-31
  Administered 2023-01-30: 1 [drp] via OPHTHALMIC
  Filled 2023-01-29: qty 5

## 2023-01-29 MED ORDER — TRANEXAMIC ACID-NACL 1000-0.7 MG/100ML-% IV SOLN
1000.0000 mg | INTRAVENOUS | Status: AC
  Administered 2023-01-29: 1000 mg via INTRAVENOUS
  Filled 2023-01-29: qty 100

## 2023-01-29 MED ORDER — DIPHENHYDRAMINE HCL 12.5 MG/5ML PO ELIX: 12.5000 mg | ORAL_SOLUTION | ORAL | Status: DC | PRN

## 2023-01-29 MED ORDER — PANTOPRAZOLE SODIUM 40 MG PO TBEC
40.0000 mg | DELAYED_RELEASE_TABLET | Freq: Every day | ORAL | Status: DC
  Administered 2023-01-29 – 2023-01-31 (×3): 40 mg via ORAL
  Filled 2023-01-29 (×3): qty 1

## 2023-01-29 MED ORDER — FENTANYL CITRATE PF 50 MCG/ML IJ SOSY
50.0000 ug | PREFILLED_SYRINGE | INTRAMUSCULAR | Status: DC
Start: 2023-01-29 — End: 2023-01-29
  Administered 2023-01-29: 50 ug via INTRAVENOUS
  Filled 2023-01-29: qty 2

## 2023-01-29 MED ORDER — AMISULPRIDE (ANTIEMETIC) 5 MG/2ML IV SOLN
INTRAVENOUS | Status: AC
  Administered 2023-01-29: 10 mg via INTRAVENOUS
  Filled 2023-01-29: qty 4

## 2023-01-29 MED ORDER — GLYCOPYRROLATE 0.2 MG/ML IJ SOLN
INTRAMUSCULAR | Status: DC | PRN
  Administered 2023-01-29 (×2): .1 mg via INTRAVENOUS
  Administered 2023-01-29: .2 mg via INTRAVENOUS

## 2023-01-29 MED ORDER — POVIDONE-IODINE 10 % EX SWAB: 2.0000 | Freq: Once | CUTANEOUS | Status: DC

## 2023-01-29 MED ORDER — OXYCODONE HCL 5 MG/5ML PO SOLN: 5.0000 mg | Freq: Once | ORAL | Status: AC | PRN

## 2023-01-29 MED ORDER — DEXAMETHASONE SODIUM PHOSPHATE 10 MG/ML IJ SOLN
INTRAMUSCULAR | Status: DC | PRN
  Administered 2023-01-29: 8 mg via INTRAVENOUS

## 2023-01-29 MED ORDER — PHENYLEPHRINE 80 MCG/ML (10ML) SYRINGE FOR IV PUSH (FOR BLOOD PRESSURE SUPPORT)
PREFILLED_SYRINGE | INTRAVENOUS | Status: DC | PRN
  Administered 2023-01-29: 160 ug via INTRAVENOUS

## 2023-01-29 MED ORDER — GLYCOPYRROLATE 0.2 MG/ML IJ SOLN
INTRAMUSCULAR | Status: AC
Start: 2023-01-29 — End: ?
  Filled 2023-01-29: qty 1

## 2023-01-29 MED ORDER — APIXABAN 2.5 MG PO TABS
2.5000 mg | ORAL_TABLET | Freq: Two times a day (BID) | ORAL | Status: DC
  Administered 2023-01-30 – 2023-01-31 (×3): 2.5 mg via ORAL
  Filled 2023-01-29 (×3): qty 1

## 2023-01-29 MED ORDER — OXYCODONE HCL 5 MG PO TABS
ORAL_TABLET | ORAL | Status: AC
  Filled 2023-01-29: qty 1

## 2023-01-29 MED ORDER — PROPOFOL 500 MG/50ML IV EMUL
INTRAVENOUS | Status: DC | PRN
  Administered 2023-01-29: 75 ug/kg/min via INTRAVENOUS

## 2023-01-29 MED ORDER — SODIUM CHLORIDE (PF) 0.9 % IJ SOLN
INTRAMUSCULAR | Status: DC | PRN
  Administered 2023-01-29: 61 mL

## 2023-01-29 MED ORDER — AMISULPRIDE (ANTIEMETIC) 5 MG/2ML IV SOLN: 10.0000 mg | Freq: Once | INTRAVENOUS | Status: AC | PRN

## 2023-01-29 MED ORDER — HYDROMORPHONE HCL 1 MG/ML IJ SOLN
0.2500 mg | INTRAMUSCULAR | Status: DC | PRN
  Administered 2023-01-29: 0.5 mg via INTRAVENOUS

## 2023-01-29 MED ORDER — BISACODYL 10 MG RE SUPP: 10.0000 mg | Freq: Every day | RECTAL | Status: DC | PRN

## 2023-01-29 MED ORDER — ROPIVACAINE HCL 5 MG/ML IJ SOLN
INTRAMUSCULAR | Status: DC | PRN
  Administered 2023-01-29: 25 mL via PERINEURAL

## 2023-01-29 MED ORDER — OXYCODONE HCL 5 MG PO TABS
5.0000 mg | ORAL_TABLET | ORAL | Status: DC | PRN
  Administered 2023-01-30 – 2023-01-31 (×3): 10 mg via ORAL
  Filled 2023-01-29 (×4): qty 2

## 2023-01-29 MED ORDER — SODIUM CHLORIDE 0.9 % IV SOLN: INTRAVENOUS | Status: DC

## 2023-01-29 MED ORDER — HYDROMORPHONE HCL 1 MG/ML IJ SOLN
INTRAMUSCULAR | Status: AC
  Administered 2023-01-29: 0.5 mg via INTRAVENOUS
  Filled 2023-01-29: qty 1

## 2023-01-29 MED ORDER — MIDAZOLAM HCL 2 MG/2ML IJ SOLN
1.0000 mg | INTRAMUSCULAR | Status: DC
  Administered 2023-01-29: 1 mg via INTRAVENOUS
  Filled 2023-01-29: qty 2

## 2023-01-29 MED ORDER — HYDROMORPHONE HCL 1 MG/ML IJ SOLN
0.5000 mg | INTRAMUSCULAR | Status: DC | PRN
  Administered 2023-01-29 – 2023-01-30 (×4): 1 mg via INTRAVENOUS
  Filled 2023-01-29 (×4): qty 1

## 2023-01-29 MED ORDER — ISOPROPYL ALCOHOL 70 % SOLN
Status: DC | PRN
  Administered 2023-01-29: 1 via TOPICAL

## 2023-01-29 MED ORDER — ALUM & MAG HYDROXIDE-SIMETH 200-200-20 MG/5ML PO SUSP: 30.0000 mL | ORAL | Status: DC | PRN

## 2023-01-29 MED ORDER — KETOROLAC TROMETHAMINE 30 MG/ML IJ SOLN
INTRAMUSCULAR | Status: AC
  Filled 2023-01-29: qty 1

## 2023-01-29 MED ORDER — PHENYLEPHRINE HCL-NACL 20-0.9 MG/250ML-% IV SOLN
INTRAVENOUS | Status: DC | PRN
  Administered 2023-01-29: 40 ug/min via INTRAVENOUS

## 2023-01-29 MED ORDER — ONDANSETRON HCL 4 MG PO TABS: 4.0000 mg | ORAL_TABLET | Freq: Four times a day (QID) | ORAL | Status: DC | PRN

## 2023-01-29 MED ORDER — LIDOCAINE HCL (PF) 2 % IJ SOLN
INTRAMUSCULAR | Status: AC
  Filled 2023-01-29: qty 5

## 2023-01-29 MED ORDER — POLYETHYLENE GLYCOL 3350 17 G PO PACK: 17.0000 g | PACK | Freq: Every day | ORAL | Status: DC | PRN

## 2023-01-29 MED ORDER — GLYCOPYRROLATE 0.2 MG/ML IJ SOLN
INTRAMUSCULAR | Status: AC
  Filled 2023-01-29: qty 1

## 2023-01-29 MED ORDER — LACTATED RINGERS IV SOLN: INTRAVENOUS | Status: DC

## 2023-01-29 MED ORDER — ONDANSETRON HCL 4 MG/2ML IJ SOLN
INTRAMUSCULAR | Status: AC
  Filled 2023-01-29: qty 2

## 2023-01-29 MED ORDER — CEFAZOLIN SODIUM-DEXTROSE 2-4 GM/100ML-% IV SOLN
2.0000 g | INTRAVENOUS | Status: AC
  Administered 2023-01-29: 2 g via INTRAVENOUS
  Filled 2023-01-29: qty 100

## 2023-01-29 MED ORDER — OXYCODONE HCL 5 MG PO TABS
10.0000 mg | ORAL_TABLET | ORAL | Status: DC | PRN
  Administered 2023-01-29: 10 mg via ORAL
  Administered 2023-01-30: 15 mg via ORAL
  Administered 2023-01-31 (×2): 10 mg via ORAL
  Filled 2023-01-29 (×2): qty 2
  Filled 2023-01-29: qty 3

## 2023-01-29 MED ORDER — LIDOCAINE 2% (20 MG/ML) 5 ML SYRINGE
INTRAMUSCULAR | Status: DC | PRN
  Administered 2023-01-29: 60 mg via INTRAVENOUS

## 2023-01-29 MED ORDER — ONDANSETRON HCL 4 MG/2ML IJ SOLN
4.0000 mg | Freq: Four times a day (QID) | INTRAMUSCULAR | Status: AC | PRN
  Administered 2023-01-29: 4 mg via INTRAVENOUS

## 2023-01-29 MED ORDER — ONDANSETRON HCL 4 MG/2ML IJ SOLN
4.0000 mg | Freq: Four times a day (QID) | INTRAMUSCULAR | Status: DC | PRN
  Administered 2023-01-30: 4 mg via INTRAVENOUS
  Filled 2023-01-29: qty 2

## 2023-01-29 MED ORDER — PROPOFOL 10 MG/ML IV BOLUS
INTRAVENOUS | Status: DC | PRN
  Administered 2023-01-29 (×4): 30 mg via INTRAVENOUS
  Administered 2023-01-29: 10 mg via INTRAVENOUS
  Administered 2023-01-29: 30 mg via INTRAVENOUS

## 2023-01-29 MED ORDER — CEFAZOLIN SODIUM-DEXTROSE 2-4 GM/100ML-% IV SOLN
2.0000 g | Freq: Four times a day (QID) | INTRAVENOUS | Status: AC
  Administered 2023-01-29 – 2023-01-30 (×2): 2 g via INTRAVENOUS
  Filled 2023-01-29 (×2): qty 100

## 2023-01-29 MED ORDER — MIDAZOLAM HCL 2 MG/2ML IJ SOLN
INTRAMUSCULAR | Status: AC
  Filled 2023-01-29: qty 2

## 2023-01-29 MED ORDER — ORAL CARE MOUTH RINSE: 15.0000 mL | Freq: Once | OROMUCOSAL | Status: AC

## 2023-01-29 MED ORDER — METOCLOPRAMIDE HCL 5 MG/ML IJ SOLN: 5.0000 mg | Freq: Three times a day (TID) | INTRAMUSCULAR | Status: DC | PRN

## 2023-01-29 MED ORDER — SODIUM CHLORIDE 0.9 % IR SOLN
Status: DC | PRN
  Administered 2023-01-29: 3000 mL
  Administered 2023-01-29: 1000 mL

## 2023-01-29 MED ORDER — BUPIVACAINE HCL (PF) 0.25 % IJ SOLN
INTRAMUSCULAR | Status: AC
  Filled 2023-01-29: qty 30

## 2023-01-29 MED ORDER — FENTANYL CITRATE PF 50 MCG/ML IJ SOSY
PREFILLED_SYRINGE | INTRAMUSCULAR | Status: AC
  Administered 2023-01-29: 50 ug via INTRAVENOUS
  Filled 2023-01-29: qty 2

## 2023-01-29 MED ORDER — OXYCODONE HCL 5 MG PO TABS
5.0000 mg | ORAL_TABLET | Freq: Once | ORAL | Status: AC | PRN
  Administered 2023-01-29: 5 mg via ORAL

## 2023-01-29 MED ORDER — METOCLOPRAMIDE HCL 5 MG PO TABS: 5.0000 mg | ORAL_TABLET | Freq: Three times a day (TID) | ORAL | Status: DC | PRN

## 2023-01-29 MED ORDER — BUPIVACAINE IN DEXTROSE 0.75-8.25 % IT SOLN
INTRATHECAL | Status: DC | PRN
  Administered 2023-01-29: 1.6 mL via INTRATHECAL

## 2023-01-29 MED ORDER — METHOCARBAMOL 500 MG PO TABS
500.0000 mg | ORAL_TABLET | Freq: Four times a day (QID) | ORAL | Status: DC | PRN
  Administered 2023-01-29 – 2023-01-31 (×5): 500 mg via ORAL
  Filled 2023-01-29 (×5): qty 1

## 2023-01-29 MED ORDER — EPINEPHRINE PF 1 MG/ML IJ SOLN
INTRAMUSCULAR | Status: AC
  Filled 2023-01-29: qty 1

## 2023-01-29 MED ORDER — CHLORHEXIDINE GLUCONATE 0.12 % MT SOLN
15.0000 mL | Freq: Once | OROMUCOSAL | Status: AC
  Administered 2023-01-29: 15 mL via OROMUCOSAL

## 2023-01-29 MED ORDER — FENTANYL CITRATE PF 50 MCG/ML IJ SOSY
PREFILLED_SYRINGE | INTRAMUSCULAR | Status: AC
  Filled 2023-01-29: qty 1

## 2023-01-29 SURGICAL SUPPLY — 63 items
BAG COUNTER SPONGE SURGICOUNT (BAG) IMPLANT
BAG ZIPLOCK 12X15 (MISCELLANEOUS) IMPLANT
BATTERY INSTRU NAVIGATION (MISCELLANEOUS) ×3 IMPLANT
BLADE SAW RECIPROCATING 77.5 (BLADE) ×1 IMPLANT
BNDG ELASTIC 4INX 5YD STR LF (GAUZE/BANDAGES/DRESSINGS) ×1 IMPLANT
BNDG ELASTIC 6INX 5YD STR LF (GAUZE/BANDAGES/DRESSINGS) ×1 IMPLANT
CHLORAPREP W/TINT 26 (MISCELLANEOUS) ×2 IMPLANT
COMP FEM KNEE PS NRW 10 LT (Joint) ×1 IMPLANT
COMP PATELLA 3 PEG 35 (Joint) ×1 IMPLANT
COMP TIB KNEE PS 0D LT (Joint) ×1 IMPLANT
COMPONENT FEM KN PS NRW 10 LT (Joint) IMPLANT
COMPONENT PATELLA 3 PEG 35 (Joint) IMPLANT
COMPONENT TIB KNEE PS 0D LT (Joint) IMPLANT
COVER SURGICAL LIGHT HANDLE (MISCELLANEOUS) ×1 IMPLANT
DERMABOND ADVANCED .7 DNX12 (GAUZE/BANDAGES/DRESSINGS) ×2 IMPLANT
DRAPE SHEET LG 3/4 BI-LAMINATE (DRAPES) ×3 IMPLANT
DRAPE U-SHAPE 47X51 STRL (DRAPES) ×1 IMPLANT
DRESSING AQUACEL AG SP 3.5X10 (GAUZE/BANDAGES/DRESSINGS) IMPLANT
DRSG AQUACEL AG ADV 3.5X10 (GAUZE/BANDAGES/DRESSINGS) ×1 IMPLANT
DRSG AQUACEL AG ADV 3.5X14 (GAUZE/BANDAGES/DRESSINGS) IMPLANT
DRSG AQUACEL AG SP 3.5X10 (GAUZE/BANDAGES/DRESSINGS) IMPLANT
ELECT BLADE TIP CTD 4 INCH (ELECTRODE) ×1 IMPLANT
ELECT REM PT RETURN 15FT ADLT (MISCELLANEOUS) ×1 IMPLANT
GAUZE SPONGE 4X4 12PLY STRL (GAUZE/BANDAGES/DRESSINGS) ×1 IMPLANT
GLOVE BIO SURGEON STRL SZ7 (GLOVE) ×1 IMPLANT
GLOVE BIO SURGEON STRL SZ8.5 (GLOVE) ×2 IMPLANT
GLOVE BIOGEL PI IND STRL 7.5 (GLOVE) ×1 IMPLANT
GLOVE BIOGEL PI IND STRL 8.5 (GLOVE) ×1 IMPLANT
GOWN SPEC L3 XXLG W/TWL (GOWN DISPOSABLE) ×1 IMPLANT
GOWN STRL REUS W/ TWL XL LVL3 (GOWN DISPOSABLE) ×1 IMPLANT
HOLDER FOLEY CATH W/STRAP (MISCELLANEOUS) ×1 IMPLANT
HOOD PEEL AWAY T7 (MISCELLANEOUS) ×3 IMPLANT
INSERT TIB PS EF/3-11 13 LT (Nail) IMPLANT
KIT TURNOVER KIT A (KITS) IMPLANT
MARKER SKIN DUAL TIP RULER LAB (MISCELLANEOUS) ×1 IMPLANT
NDL SAFETY ECLIPSE 18X1.5 (NEEDLE) ×1 IMPLANT
NDL SPNL 18GX3.5 QUINCKE PK (NEEDLE) ×1 IMPLANT
NEEDLE SPNL 18GX3.5 QUINCKE PK (NEEDLE) ×1 IMPLANT
NS IRRIG 1000ML POUR BTL (IV SOLUTION) ×1 IMPLANT
PACK TOTAL KNEE CUSTOM (KITS) ×1 IMPLANT
PADDING CAST ABS COTTON 6X4 NS (CAST SUPPLIES) IMPLANT
PADDING CAST COTTON 6X4 STRL (CAST SUPPLIES) ×1 IMPLANT
PIN DRILL HDLS TROCAR 75 4PK (PIN) IMPLANT
PROTECTOR NERVE ULNAR (MISCELLANEOUS) ×1 IMPLANT
SAW OSC TIP CART 19.5X105X1.3 (SAW) ×1 IMPLANT
SCREW FEMALE HEX FIX 25X2.5 (ORTHOPEDIC DISPOSABLE SUPPLIES) IMPLANT
SEALER BIPOLAR AQUA 6.0 (INSTRUMENTS) ×1 IMPLANT
SET HNDPC FAN SPRY TIP SCT (DISPOSABLE) ×1 IMPLANT
SET PAD KNEE POSITIONER (MISCELLANEOUS) ×1 IMPLANT
SOLUTION PRONTOSAN WOUND 350ML (IRRIGATION / IRRIGATOR) IMPLANT
SPIKE FLUID TRANSFER (MISCELLANEOUS) ×2 IMPLANT
SUT MNCRL AB 3-0 PS2 18 (SUTURE) ×1 IMPLANT
SUT MON AB 2-0 CT1 36 (SUTURE) ×1 IMPLANT
SUT STRATAFIX 14 PDO 48 VLT (SUTURE) ×1 IMPLANT
SUT STRATAFIX PDO 1 14 VIOLET (SUTURE) ×1 IMPLANT
SUT VIC AB 1 CTX36XBRD ANBCTR (SUTURE) ×2 IMPLANT
SUT VIC AB 2-0 CT1 TAPERPNT 27 (SUTURE) ×1 IMPLANT
SYR 3ML LL SCALE MARK (SYRINGE) ×1 IMPLANT
TOWEL GREEN STERILE FF (TOWEL DISPOSABLE) ×1 IMPLANT
TRAY FOLEY MTR SLVR 16FR STAT (SET/KITS/TRAYS/PACK) IMPLANT
TUBE SUCTION HIGH CAP CLEAR NV (SUCTIONS) ×1 IMPLANT
WATER STERILE IRR 1000ML POUR (IV SOLUTION) ×2 IMPLANT
WRAP KNEE MAXI GEL POST OP (GAUZE/BANDAGES/DRESSINGS) IMPLANT

## 2023-01-29 NOTE — Op Note (Signed)
OPERATIVE REPORT  SURGEON: Samson Frederic, MD   ASSISTANT: Clint Bolder, PA-C  PREOPERATIVE DIAGNOSIS: Primary Left knee arthritis.   POSTOPERATIVE DIAGNOSIS: Primary Left knee arthritis.   PROCEDURE: Computer assisted Left total knee arthroplasty.   IMPLANTS: Zimmer Persona PPS Cementless CR femur, size 10 Narrow. Persona 0 degree Spiked Keel OsseoTi Tibia, size F. Vivacit-E polyethelyene insert, size 13 mm, CR. OsseoTi 3-Peg patella, size 35 mm.  ANESTHESIA:  MAC, Regional, and Spinal  TOURNIQUET TIME: Not utilized.   ESTIMATED BLOOD LOSS:-150 mL    ANTIBIOTICS: 2g Ancef.  DRAINS: None.  COMPLICATIONS: None   CONDITION: PACU - hemodynamically stable.   BRIEF CLINICAL NOTE: [name] is a 63 y.o. female with a long-standing history of Left knee arthritis. After failing conservative management, the patient was indicated for total knee arthroplasty. The risks, benefits, and alternatives to the procedure were explained, and the patient elected to proceed.  PROCEDURE IN DETAIL: Adductor canal block was obtained in the pre-op holding area. Once inside the operative room, spinal anesthesia was obtained, and a foley catheter was inserted. The patient was then positioned and the lower extremity was prepped and draped in the normal sterile surgical fashion.  A time-out was called verifying side and site of surgery. The patient received IV antibiotics within 60 minutes of beginning the procedure. A tourniquet was not utilized.   An anterior approach to the knee was performed utilizing a midvastus arthrotomy. A medial release was performed and the patellar fat pad was excised. Stryker imageless navigation was used to cut the distal femur perpendicular to the mechanical axis. A freehand patellar resection was performed, and the patella was sized an prepared with 3 lug holes.  Nagivation was used to make a neutral proximal tibia resection, taking 8 mm of bone from the less affected  lateral side with 3 degrees of slope. The menisci were excised. A spacer block was placed, and the alignment and balance in extension were confirmed.   The distal femur was sized using the 3-degree external rotation guide referencing the posterior femoral cortex. The appropriate 4-in-1 cutting block was pinned into place. Rotation was checked using Whiteside's line, the epicondylar axis, and then confirmed with a spacer block in flexion. The remaining femoral cuts were performed, taking care to protect the MCL.  The tibia was sized and the trial tray was pinned into place. The remaining trail components were inserted. The knee was stable to varus and valgus stress through a full range of motion. The patella tracked centrally, and the PCL was well balanced. The trial components were removed, and the proximal tibial surface was prepared. Final components were impacted into place. The knee was tested for a final time and found to be well balanced.   The wound was copiously irrigated with Prontosan solution and normal saline using pulse lavage.  Marcaine solution was injected into the periarticular soft tissue.  The wound was closed in layers using #1 Vicryl and Stratafix for the fascia, 2-0 Vicryl for the subcutaneous fat, 2-0 Monocryl for the deep dermal layer, 3-0 running Monocryl subcuticular Stitch, and 4-0 Monocryl stay sutures at both ends of the wound. Dermabond was applied to the skin.  Once the glue was fully dried, an Aquacell Ag and compressive dressing were applied.  The patient was transported to the recovery room in stable condition.  Sponge, needle, and instrument counts were correct at the end of the case x2.  The patient tolerated the procedure well and there were no known  complications.  The aquamantis was utilized for this case to help facilitate better hemostasis as patient was felt to be at increased risk of bleeding because of obesity, history of coagulopathy, recent anticoagulation use,  and complex case requiring increased OR time and/or exposure.  -minimally invasive approach.  A oscillating saw tip was utilized for this case to prevent damage to the soft tissue structures such as muscles, ligaments and tendons, and to ensure accurate bone cuts. This patient was at increased risk for above structures due to  minimally invasive approach.  Please note that a surgical assistant was a medical necessity for this procedure in order to perform it in a safe and expeditious manner. Surgical assistant was necessary to retract the ligaments and vital neurovascular structures to prevent injury to them and also necessary for proper positioning of the limb to allow for anatomic placement of the prosthesis.

## 2023-01-29 NOTE — Anesthesia Procedure Notes (Signed)
Spinal  Patient location during procedure: OR Start time: 01/29/2023 12:50 PM End time: 01/29/2023 12:54 PM Reason for block: surgical anesthesia Staffing Performed: anesthesiologist  Anesthesiologist: Achille Rich, MD Performed by: Achille Rich, MD Authorized by: Achille Rich, MD   Preanesthetic Checklist Completed: patient identified, IV checked, risks and benefits discussed, surgical consent, monitors and equipment checked, pre-op evaluation and timeout performed Spinal Block Patient position: sitting Prep: DuraPrep Patient monitoring: cardiac monitor, continuous pulse ox and blood pressure Approach: midline Location: L3-4 Injection technique: single-shot Needle Needle type: Pencan  Needle gauge: 24 G Needle length: 9 cm Assessment Sensory level: T10 Events: CSF return Additional Notes Functioning IV was confirmed and monitors were applied. Sterile prep and drape, including hand hygiene and sterile gloves were used. The patient was positioned and the spine was prepped. The skin was anesthetized with lidocaine.  Free flow of clear CSF was obtained prior to injecting local anesthetic into the CSF.  The spinal needle aspirated freely following injection.  The needle was carefully withdrawn.  The patient tolerated the procedure well.

## 2023-01-29 NOTE — Anesthesia Preprocedure Evaluation (Signed)
Anesthesia Evaluation  Patient identified by MRN, date of birth, ID band Patient awake    Reviewed: Allergy & Precautions, H&P , NPO status , Patient's Chart, lab work & pertinent test results  History of Anesthesia Complications (+) PONV and history of anesthetic complications  Airway Mallampati: II   Neck ROM: full    Dental   Pulmonary former smoker   breath sounds clear to auscultation       Cardiovascular hypertension, + Peripheral Vascular Disease and + DVT   Rhythm:regular Rate:Normal     Neuro/Psych   Anxiety        GI/Hepatic   Endo/Other    Renal/GU      Musculoskeletal  (+) Arthritis ,    Abdominal   Peds  Hematology   Anesthesia Other Findings   Reproductive/Obstetrics                             Anesthesia Physical Anesthesia Plan  ASA: 3  Anesthesia Plan: Spinal and MAC   Post-op Pain Management: Regional block*   Induction: Intravenous  PONV Risk Score and Plan: 3 and Propofol infusion, Midazolam and Treatment may vary due to age or medical condition  Airway Management Planned: Simple Face Mask  Additional Equipment:   Intra-op Plan:   Post-operative Plan:   Informed Consent: I have reviewed the patients History and Physical, chart, labs and discussed the procedure including the risks, benefits and alternatives for the proposed anesthesia with the patient or authorized representative who has indicated his/her understanding and acceptance.     Dental advisory given  Plan Discussed with: CRNA, Anesthesiologist and Surgeon  Anesthesia Plan Comments:        Anesthesia Quick Evaluation

## 2023-01-29 NOTE — Discharge Instructions (Signed)

## 2023-01-29 NOTE — Transfer of Care (Signed)
Immediate Anesthesia Transfer of Care Note  Patient: [name]  Procedure(s) Performed: COMPUTER ASSISTED TOTAL KNEE ARTHROPLASTY (Left: Knee)  Patient Location: PACU  Anesthesia Type:Regional and Spinal  Level of Consciousness: awake, alert , and patient cooperative  Airway & Oxygen Therapy: Patient Spontanous Breathing and Patient connected to face mask oxygen  Post-op Assessment: Report given to RN and Post -op Vital signs reviewed and stable  Post vital signs: Reviewed and stable  Last Vitals:  Vitals Value Taken Time  BP 156/93 01/29/23 1552  Temp    Pulse 76 01/29/23 1555  Resp 13 01/29/23 1555  SpO2 98 % 01/29/23 1555  Vitals shown include unfiled device data.  Last Pain:  Vitals:   01/29/23 1018  TempSrc: Oral  PainSc: 5       Patients Stated Pain Goal: 4 (01/29/23 1018)  Complications: No notable events documented.

## 2023-01-29 NOTE — Interval H&P Note (Signed)
History and Physical Interval Note:  01/29/2023 10:51 AM  [name]  has presented today for surgery, with the diagnosis of Right knee osteoarthritis.  The various methods of treatment have been discussed with the patient and family. After consideration of risks, benefits and other options for treatment, the patient has consented to  Procedure(s) with comments: COMPUTER ASSISTED TOTAL KNEE ARTHROPLASTY (Left) - 160 as a surgical intervention.  The patient's history has been reviewed, patient examined, no change in status, stable for surgery.  I have reviewed the patient's chart and labs.  Questions were answered to the patient's satisfaction.     Iline Oven Reba Hulett

## 2023-01-29 NOTE — Anesthesia Procedure Notes (Signed)
Anesthesia Regional Block: Adductor canal block   Pre-Anesthetic Checklist: , timeout performed,  Correct Patient, Correct Site, Correct Laterality,  Correct Procedure, Correct Position, site marked,  Risks and benefits discussed,  Surgical consent,  Pre-op evaluation,  At surgeon's request and post-op pain management  Laterality: Left  Prep: chloraprep       Needles:  Injection technique: Single-shot  Needle Type: Echogenic Needle     Needle Length: 9cm  Needle Gauge: 21     Additional Needles:   Narrative:  Start time: 01/29/2023 12:09 PM End time: 01/29/2023 12:17 PM Injection made incrementally with aspirations every 5 mL.  Performed by: Personally  Anesthesiologist: Achille Rich, MD  Additional Notes: Pt tolerated the procedure well.

## 2023-01-29 NOTE — Anesthesia Postprocedure Evaluation (Signed)
Anesthesia Post Note  Patient: CHARDA DEVOR  Procedure(s) Performed: COMPUTER ASSISTED TOTAL KNEE ARTHROPLASTY (Left: Knee)     Patient location during evaluation: PACU Anesthesia Type: MAC, Spinal and Regional Level of consciousness: oriented and awake and alert Pain management: pain level controlled Vital Signs Assessment: post-procedure vital signs reviewed and stable Respiratory status: spontaneous breathing, respiratory function stable and patient connected to nasal cannula oxygen Cardiovascular status: blood pressure returned to baseline and stable Postop Assessment: no headache, no backache and no apparent nausea or vomiting Anesthetic complications: no   No notable events documented.  Last Vitals:  Vitals:   01/29/23 1800 01/29/23 1839  BP: (!) 157/96 131/78  Pulse: 71 (!) 56  Resp: 16 16  Temp: (!) 36.4 C (!) 36.3 C  SpO2: 95% 100%    Last Pain:  Vitals:   01/29/23 1958  TempSrc:   PainSc: 4                  Kiersten Coss S

## 2023-01-30 ENCOUNTER — Other Ambulatory Visit: Payer: Self-pay

## 2023-01-30 DIAGNOSIS — M170 Bilateral primary osteoarthritis of knee: Secondary | ICD-10-CM | POA: Diagnosis not present

## 2023-01-30 LAB — BASIC METABOLIC PANEL
Anion gap: 10 (ref 5–15)
BUN: 23 mg/dL — ABNORMAL HIGH (ref 6–20)
CO2: 23 mmol/L (ref 22–32)
Calcium: 9 mg/dL (ref 8.9–10.3)
Chloride: 103 mmol/L (ref 98–111)
Creatinine, Ser: 0.87 mg/dL (ref 0.44–1.00)
GFR, Estimated: >60 mL/min (ref >60)
Glucose, Bld: 114 mg/dL — ABNORMAL HIGH (ref 70–99)
Potassium: 4.6 mmol/L (ref 3.5–5.1)
Sodium: 136 mmol/L (ref 135–145)

## 2023-01-30 LAB — CBC
HCT: 31.4 % — ABNORMAL LOW (ref 36.0–46.0)
Hemoglobin: 9.7 g/dL — ABNORMAL LOW (ref 12.0–15.0)
MCH: 29.4 pg (ref 26.0–34.0)
MCHC: 30.9 g/dL (ref 30.0–36.0)
MCV: 95.2 fL (ref 80.0–100.0)
Platelets: 217 10*3/uL (ref 150–400)
RBC: 3.3 MIL/uL — ABNORMAL LOW (ref 3.87–5.11)
RDW: 15.6 % — ABNORMAL HIGH (ref 11.5–15.5)
WBC: 4.6 10*3/uL (ref 4.0–10.5)
nRBC: 0 % (ref 0.0–0.2)

## 2023-01-30 MED ORDER — OXYCODONE HCL 5 MG PO TABS: 5.0000 mg | ORAL_TABLET | ORAL | 0 refills | Status: DC | PRN

## 2023-01-30 MED ORDER — METHOCARBAMOL 500 MG PO TABS: 500.0000 mg | ORAL_TABLET | Freq: Four times a day (QID) | ORAL | 0 refills | Status: DC | PRN

## 2023-01-30 MED ORDER — ONDANSETRON HCL 4 MG PO TABS: 4.0000 mg | ORAL_TABLET | Freq: Three times a day (TID) | ORAL | 0 refills | Status: DC | PRN

## 2023-01-30 MED ORDER — POLYETHYLENE GLYCOL 3350 17 G PO PACK
17.0000 g | PACK | Freq: Every day | ORAL | 0 refills | Status: AC | PRN
Start: 2023-01-30 — End: 2023-03-01

## 2023-01-30 MED ORDER — DOCUSATE SODIUM 100 MG PO CAPS: 100.0000 mg | ORAL_CAPSULE | Freq: Two times a day (BID) | ORAL | 0 refills | Status: AC

## 2023-01-30 MED ORDER — SENNA 8.6 MG PO TABS: 2.0000 | ORAL_TABLET | Freq: Every day | ORAL | 0 refills | Status: AC

## 2023-01-30 NOTE — Evaluation (Signed)
Physical Therapy Evaluation Patient Details Name: [name] MRN: [id number] DOB: Aug 06, 1960 Today's Date: 01/30/2023  History of Present Illness  Pt is a 63 year old female s/p L TKA on 01/29/23.  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  Pt will benefit from acute skilled PT to increase their independence and safety with mobility to allow discharge.  Pt received IV pain meds and muscle relaxer this morning and reported nausea upon initially checking in.  RN provided nausea medication and pt agreeable to mobilize.  Pt continued to report nausea and dizziness (but states they are the same thing) with ambulation and assisted safely to recliner.  Pt reports she has DME from her previous TKA and plans to have her sister stay with her upon d/c for assist as needed.         If plan is discharge home, recommend the following: Help with stairs or ramp for entrance;Assistance with cooking/housework   Can travel by private vehicle        Equipment Recommendations None recommended by PT  Recommendations for Other Services       Functional Status Assessment Patient has had a recent decline in their functional status and demonstrates the ability to make significant improvements in function in a reasonable and predictable amount of time.     Precautions / Restrictions Precautions Precautions: Fall;Knee Restrictions Weight Bearing Restrictions Per Provider Order: No      Mobility  Bed Mobility Overal bed mobility: Needs Assistance Bed Mobility: Supine to Sit     Supine to sit: Contact guard, HOB elevated     General bed mobility comments: verbal cues for technique, pt self assisted L LE using gait belt    Transfers Overall transfer level: Needs assistance Equipment used: Rolling walker (2 wheels) Transfers: Sit to/from Stand Sit to Stand: Contact guard assist            General transfer comment: verbal cues for UE and LE positioning for pain control    Ambulation/Gait Ambulation/Gait assistance: Contact guard assist Gait Distance (Feet): 60 Feet Assistive device: Rolling walker (2 wheels) Gait Pattern/deviations: Step-to pattern, Decreased stance time - left, Antalgic Gait velocity: decr     General Gait Details: verbal cues for sequence, RW positioning, posture; pt reports nausea and some dizziness but denies spinning/lightheadedness (reports nausea and dizziness are the same thing); declined need for recliner so ambulated to tolerance  Stairs            Wheelchair Mobility     Tilt Bed    Modified Rankin (Stroke Patients Only)       Balance                                             Pertinent Vitals/Pain Pain Assessment Pain Assessment: 0-10 Pain Score: 5  Pain Location: left knee Pain Descriptors / Indicators: Sore, Guarding, Aching Pain Intervention(s): Monitored during session, Repositioned    Home Living Family/patient expects to be discharged to:: Private residence Living Arrangements: Alone Available Help at Discharge: Family (sister to assist upon d/c) Type of Home: House Home Access: Stairs to enter Entrance Stairs-Rails: Doctor, general practice of Steps: 3   Home Layout: Able to live on main level with bedroom/bathroom Home Equipment: Rolling  Walker (2 wheels)      Prior Function Prior Level of Function : Independent/Modified Independent                     Extremity/Trunk Assessment        Lower Extremity Assessment Lower Extremity Assessment: LLE deficits/detail LLE Deficits / Details: unable to perform SLR, able to perform ankle pumps LLE: Unable to fully assess due to pain       Communication   Communication Communication: No apparent difficulties  Cognition Arousal: Alert Behavior During Therapy: WFL for tasks assessed/performed Overall  Cognitive Status: Within Functional Limits for tasks assessed                                          General Comments      Exercises     Assessment/Plan    PT Assessment Patient needs continued PT services  PT Problem List Decreased strength;Decreased range of motion;Pain;Decreased mobility;Decreased knowledge of use of DME;Decreased knowledge of precautions       PT Treatment Interventions DME instruction;Gait training;Neuromuscular re-education;Balance training;Therapeutic activities;Therapeutic exercise;Patient/family education;Functional mobility training;Stair training    PT Goals (Current goals can be found in the Care Plan section)  Acute Rehab PT Goals PT Goal Formulation: With patient Time For Goal Achievement: 02/13/23 Potential to Achieve Goals: Good    Frequency 7X/week     Co-evaluation               AM-PAC PT "6 Clicks" Mobility  Outcome Measure Help needed turning from your back to your side while in a flat bed without using bedrails?: A Little Help needed moving from lying on your back to sitting on the side of a flat bed without using bedrails?: A Little Help needed moving to and from a bed to a chair (including a wheelchair)?: A Little Help needed standing up from a chair using your arms (e.g., wheelchair or bedside chair)?: A Little Help needed to walk in hospital room?: A Little Help needed climbing 3-5 steps with a railing? : A Lot 6 Click Score: 17    End of Session Equipment Utilized During Treatment: Gait belt Activity Tolerance: Patient tolerated treatment well Patient left: in chair;with call bell/phone within reach;with chair alarm set   PT Visit Diagnosis: Other abnormalities of gait and mobility (R26.89)    Time: 1610-9604 PT Time Calculation (min) (ACUTE ONLY): 22 min   Charges:   PT Evaluation $PT Eval Low Complexity: 1 Low   PT General Charges $$ ACUTE PT VISIT: 1 Visit       Thomasene Mohair PT, DPT Physical  Therapist Acute Rehabilitation Services Office: [phone]   [name] 01/30/2023, 12:28 PM

## 2023-01-30 NOTE — Progress Notes (Signed)
    Subjective:  Patient reports pain as moderate to severe.  Denies N/V/CP/SOB/Abd pain. Patient having increased pain during exam this morning. She reports it has been okay she just needs some medication. She states she has slight numbness in LLE, but it is improving. She is eager for d/c home this date.   Objective:   VITALS:   Vitals:   01/29/23 1839 01/29/23 2257 01/30/23 0101 01/30/23 0518  BP: 131/78 (!) 148/80 133/82 114/73  Pulse: (!) 56 70 (!) 59 60  Resp: 16 18 16 16   Temp: (!) 97.4 F (36.3 C) (!) 97.4 F (36.3 C) 97.7 F (36.5 C) 98.4 F (36.9 C)  TempSrc:  Oral Oral   SpO2: 100% 100% 100% 100%  Weight:      Height:        NAD Neurologically intact ABD soft Neurovascular intact Sensation intact distally Intact pulses distally Dorsiflexion/Plantar flexion intact Incision: dressing C/D/I No cellulitis present Compartment soft   Lab Results  Component Value Date   WBC 4.6 01/30/2023   HGB 9.7 (L) 01/30/2023   HCT 31.4 (L) 01/30/2023   MCV 95.2 01/30/2023   PLT 217 01/30/2023   BMET    Component Value Date/Time   NA 138 01/22/2023 1438   K 4.4 01/22/2023 1438   CL 105 01/22/2023 1438   CO2 24 01/22/2023 1438   GLUCOSE 106 (H) 01/22/2023 1438   BUN 18 01/22/2023 1438   CREATININE 1.05 (H) 01/22/2023 1438   CALCIUM 9.5 01/22/2023 1438   GFRNONAA >60 01/22/2023 1438     Assessment/Plan: 1 Day Post-Op   Principal Problem:   S/P total knee arthroplasty, left  ABLA. Hemoglobin 9.1. Continue to monitor.   WBAT with walker DVT ppx:  Eliquis , SCDs, TEDS PO pain control: slight increase in pain this morning.  PT/OT: To come by today.  Dispo:  - D/c pending PT clearance, pain control, and BMP results.   Clois Dupes, PA-C 01/30/2023, 8:41 AM   [facility]  [place] [address], [place], [place] [address] Phone: [phone] [email or web address] Facebook  [employer]

## 2023-01-30 NOTE — TOC Transition Note (Signed)
Transition of Care [facility]) - Discharge Note   Patient Details  Name: [name] MRN: [id number] Date of Birth: November 15, 1960  [facility] CM/SW Contact:  Amada Jupiter, LCSW Phone Number: 01/30/2023, 1:35 PM   Clinical Narrative:    Met with pt who confirms she has needed DME in the home.  OPPT already arranged with [facility].  No further TOC needs.   Final next level of care: OP Rehab Barriers to Discharge: No Barriers Identified   Patient Goals and CMS Choice Patient states their goals for this hospitalization and ongoing recovery are:: return home          Discharge Placement                       Discharge Plan and Services Additional resources added to the After Visit Summary for                  DME Arranged: N/A DME Agency: NA                  Social Drivers of Health (SDOH) Interventions SDOH Screenings   Food Insecurity: No Food Insecurity (01/30/2023)  Housing: Low Risk  (01/30/2023)  Transportation Needs: No Transportation Needs (01/30/2023)  Utilities: Not At Risk (01/30/2023)  Financial Resource Strain: Low Risk  (01/16/2022)   Received from [facility]  Social Connections: Unknown (06/19/2022)   Received from [facility]  Tobacco Use: Medium Risk (01/29/2023)     Readmission Risk Interventions     No data to display

## 2023-01-30 NOTE — Progress Notes (Signed)
Physical Therapy Treatment Patient Details Name: [name] MRN: [id number] DOB: [birth year] Today's Date: 01/30/2023   History of Present Illness Pt is a 63 year old female s/p L TKA on 01/29/23.  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  Pt returned to bed and performed a couple exercises (knee extension, flexion and ankle pumps).  Pt hopeful nausea will be improved tomorrow as she wishes to discharge home.  Pt will need to be able to perform a few steps to enter home.  Vitals below obtained after ambulation upon sitting on bed.  01/30/23 1501  Vital Signs  Pulse Rate 66  Pulse Rate Source Dinamap  BP (!) 150/61  BP Location Left Arm  BP Method Automatic  Patient Position (if appropriate) Sitting      If plan is discharge home, recommend the following: Help with stairs or ramp for entrance;Assistance with cooking/housework   Can travel by private vehicle        Equipment Recommendations  None recommended by PT    Recommendations for Other Services       Precautions / Restrictions Precautions Precautions: Fall;Knee Restrictions Weight Bearing Restrictions Per Provider Order: No     Mobility  Bed Mobility Overal bed mobility: Needs Assistance Bed Mobility: Supine to Sit, Sit to Supine     Supine to sit: Contact guard, HOB elevated Sit to supine: Contact guard assist   General bed mobility comments: verbal cues for technique, pt self assisted L LE using gait belt    Transfers Overall transfer level: Needs assistance Equipment used: Rolling walker (2 wheels) Transfers: Sit to/from Stand Sit to Stand: Contact guard assist           General transfer comment: verbal cues for UE and LE positioning for pain control    Ambulation/Gait Ambulation/Gait assistance: Contact guard assist Gait  Distance (Feet): 150 Feet Assistive device: Rolling walker (2 wheels) Gait Pattern/deviations: Step-to pattern, Decreased stance time - left, Antalgic Gait velocity: decr     General Gait Details: verbal cues for sequence, RW positioning, posture; pt reports nausea and pain however wanting to keep moving and declined recliner; vitals obtained upon return to bed   Stairs             Wheelchair Mobility     Tilt Bed    Modified Rankin (Stroke Patients Only)       Balance                                            Cognition Arousal: Alert Behavior During Therapy: WFL for tasks assessed/performed Overall Cognitive Status: Within Functional Limits for tasks assessed                                          Exercises Total Joint Exercises Ankle Circles/Pumps: AROM, Both, 10 reps Quad Sets: AROM, 10 reps, Both Heel Slides: AAROM, Left, 10 reps Goniometric ROM: left knee AAROM approx 7-40* limited by pain    General Comments        Pertinent Vitals/Pain Pain Assessment Pain Assessment: 0-10 Pain Score: 8  Pain Location: left knee Pain Descriptors / Indicators: Sore, Guarding, Aching Pain Intervention(s): Monitored during session, Repositioned, Premedicated before session    Home Living Family/patient expects to be discharged to:: Private residence Living Arrangements: Alone Available Help at Discharge: Family (sister to assist upon d/c) Type of Home: House Home Access: Stairs to enter Entrance Stairs-Rails: Doctor, general practice of Steps: 3   Home Layout: Able to live on main level with bedroom/bathroom Home Equipment: Agricultural consultant (2 wheels)      Prior Function            PT Goals (current goals can now be found in the care plan section) Acute Rehab PT Goals PT Goal Formulation: With patient Time For Goal Achievement: 02/13/23 Potential to Achieve Goals: Good Progress towards PT goals: Progressing  toward goals    Frequency    7X/week      PT Plan      Co-evaluation              AM-PAC PT "6 Clicks" Mobility   Outcome Measure  Help needed turning from your back to your side while in a flat bed without using bedrails?: A Little Help needed moving from lying on your back to sitting on the side of a flat bed without using bedrails?: A Little Help needed moving to and from a bed to a chair (including a wheelchair)?: A Little Help needed standing up from a chair using your arms (e.g., wheelchair or bedside chair)?: A Little Help needed to walk in hospital room?: A Little Help needed climbing 3-5 steps with a railing? : A Lot 6 Click Score: 17    End of Session Equipment Utilized During Treatment: Gait belt Activity Tolerance: Patient tolerated treatment well Patient left: with call bell/phone within reach;in bed;with bed alarm set   PT Visit Diagnosis: Other abnormalities of gait and mobility (R26.89)     Time: 8295-6213 PT Time Calculation (min) (ACUTE ONLY): 26 min  Charges:    $Gait Training: 23-37 mins PT General Charges $$ ACUTE PT VISIT: 1 Visit                     Paulino Door, DPT Physical Therapist Acute Rehabilitation Services Office: [phone]    [name] 01/30/2023, 4:05 PM

## 2023-01-31 DIAGNOSIS — M170 Bilateral primary osteoarthritis of knee: Secondary | ICD-10-CM | POA: Diagnosis not present

## 2023-01-31 LAB — CBC
HCT: 32.2 % — ABNORMAL LOW (ref 36.0–46.0)
Hemoglobin: 10.2 g/dL — ABNORMAL LOW (ref 12.0–15.0)
MCH: 29.6 pg (ref 26.0–34.0)
MCHC: 31.7 g/dL (ref 30.0–36.0)
MCV: 93.3 fL (ref 80.0–100.0)
Platelets: 237 10*3/uL (ref 150–400)
RBC: 3.45 MIL/uL — ABNORMAL LOW (ref 3.87–5.11)
RDW: 15.6 % — ABNORMAL HIGH (ref 11.5–15.5)
WBC: 5.9 10*3/uL (ref 4.0–10.5)
nRBC: 0 % (ref 0.0–0.2)

## 2023-01-31 LAB — BASIC METABOLIC PANEL
Anion gap: 6 (ref 5–15)
BUN: 22 mg/dL — ABNORMAL HIGH (ref 6–20)
CO2: 24 mmol/L (ref 22–32)
Calcium: 8.4 mg/dL — ABNORMAL LOW (ref 8.9–10.3)
Chloride: 100 mmol/L (ref 98–111)
Creatinine, Ser: 0.94 mg/dL (ref 0.44–1.00)
GFR, Estimated: >60 mL/min (ref >60)
Glucose, Bld: 118 mg/dL — ABNORMAL HIGH (ref 70–99)
Potassium: 4.3 mmol/L (ref 3.5–5.1)
Sodium: 130 mmol/L — ABNORMAL LOW (ref 135–145)

## 2023-01-31 NOTE — Progress Notes (Signed)
Physical Therapy Treatment Patient Details Name: [name] MRN: [id number] DOB: 08-22-1960 Today's Date: 01/31/2023   History of Present Illness Pt is a 63 year old female s/p L TKA on 01/29/23.  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  Pt reports nausea and pain again however very eager to d/c home today.  Pt provided with HEP handout and verbally reviewed.  Pt had no further questions and feels ready for d/c home.     If plan is discharge home, recommend the following: Help with stairs or ramp for entrance;Assistance with cooking/housework   Can travel by private vehicle        Equipment Recommendations  None recommended by PT    Recommendations for Other Services       Precautions / Restrictions Precautions Precautions: Fall;Knee Restrictions LLE Weight Bearing Per Provider Order: Weight bearing as tolerated     Mobility  Bed Mobility Overal bed mobility: Needs Assistance Bed Mobility: Supine to Sit, Sit to Supine     Supine to sit: HOB elevated, Supervision Sit to supine: Supervision   General bed mobility comments: pt self assisted L LE using gait belt    Transfers Overall transfer level: Needs assistance Equipment used: Rolling walker (2 wheels) Transfers: Sit to/from Stand Sit to Stand: Contact guard assist           General transfer comment: verbal cues for UE and LE positioning for pain control    Ambulation/Gait Ambulation/Gait assistance: Contact guard assist Gait Distance (Feet): 80 Feet Assistive device: Rolling walker (2 wheels) Gait Pattern/deviations: Step-to pattern, Decreased stance time - left, Antalgic       General Gait Details: verbal cues for sequence, RW positioning, posture; pt reports nausea and pain however wanting to keep moving and declined recliner   Stairs Stairs: Yes Stairs assistance: Contact guard assist Stair  Management: Step to pattern, Backwards, With walker Number of Stairs: 2 General stair comments: verbal cues for sequence, RW positioning, safety; pt performed once and reports understanding; provided handout on technique   Wheelchair Mobility     Tilt Bed    Modified Rankin (Stroke Patients Only)       Balance                                            Cognition Arousal: Alert Behavior During Therapy: WFL for tasks assessed/performed Overall Cognitive Status: Within Functional Limits for tasks assessed                                          Exercises      General Comments        Pertinent Vitals/Pain Pain Assessment Pain Assessment: 0-10 Pain Score: 7  Pain Location: left knee Pain Descriptors / Indicators: Sore, Guarding, Aching Pain Intervention(s): Premedicated before session, Repositioned, Monitored during session, Ice applied    Home Living                          Prior Function            PT Goals (current goals can now be found in the care plan section) Progress  towards PT goals: Progressing toward goals    Frequency    7X/week      PT Plan      Co-evaluation              AM-PAC PT "6 Clicks" Mobility   Outcome Measure  Help needed turning from your back to your side while in a flat bed without using bedrails?: A Little Help needed moving from lying on your back to sitting on the side of a flat bed without using bedrails?: A Little Help needed moving to and from a bed to a chair (including a wheelchair)?: A Little Help needed standing up from a chair using your arms (e.g., wheelchair or bedside chair)?: A Little Help needed to walk in hospital room?: A Little Help needed climbing 3-5 steps with a railing? : A Little 6 Click Score: 18    End of Session Equipment Utilized During Treatment: Gait belt Activity Tolerance: Patient tolerated treatment well Patient left: in bed;with call  bell/phone within reach Nurse Communication: Mobility status PT Visit Diagnosis: Other abnormalities of gait and mobility (R26.89)     Time: 1610-9604 PT Time Calculation (min) (ACUTE ONLY): 22 min  Charges:    $Gait Training: 8-22 mins PT General Charges $$ ACUTE PT VISIT: 1 Visit                    Paulino Door, DPT Physical Therapist Acute Rehabilitation Services Office: [phone]   Kati L Payson 01/31/2023, 12:30 PM

## 2023-01-31 NOTE — Progress Notes (Signed)
[name]  MRN: [id number] DOB/Age: [birth year] 63 y.o. [facility] Procedure: Procedure(s) (LRB): COMPUTER ASSISTED TOTAL KNEE ARTHROPLASTY (Left)     Subjective: Was possibly set to discharege yesterday but developed a lot of nausea and urinary retention. This has resolved and is feeling better to DC after PT today  Vital Signs Temp:  [98 F (36.7 C)-99.1 F (37.3 C)] 98.4 F (36.9 C) (01/18 0657) Pulse Rate:  [61-92] 92 (01/18 0657) Resp:  [16-19] 19 (01/18 0657) BP: (133-150)/(61-81) 149/78 (01/18 0657) SpO2:  [96 %-97 %] 96 % (01/18 0657)  Lab Results Recent Labs    01/30/23 0328 01/31/23 0334  WBC 4.6 5.9  HGB 9.7* 10.2*  HCT 31.4* 32.2*  PLT 217 237   BMET Recent Labs    01/30/23 0758 01/31/23 0334  NA 136 130*  K 4.6 4.3  CL 103 100  CO2 23 24  GLUCOSE 114* 118*  BUN 23* 22*  CREATININE 0.87 0.94  CALCIUM 9.0 8.4*   POC INR  Date Value Ref Range Status  10/09/2008 2.3     INR  Date Value Ref Range Status  11/30/2020 1.0 0.8 - 1.2 Final    Comment:    (NOTE) INR goal varies based on device and disease states. Performed at [facility], [address], [place], [place] [address]      Exam NVI        Plan Dc after PT  Ralene Bathe PA-C  01/31/2023, 9:16 AM Contact # [phone]

## 2023-01-31 NOTE — Plan of Care (Signed)
  Problem: Activity: Goal: Risk for activity intolerance will decrease Outcome: Progressing   Problem: Nutrition: Goal: Adequate nutrition will be maintained Outcome: Progressing   Problem: Coping: Goal: Level of anxiety will decrease Outcome: Progressing   

## 2023-01-31 NOTE — Plan of Care (Signed)
  Problem: Coping: Goal: Level of anxiety will decrease Outcome: Progressing   Problem: Pain Managment: Goal: General experience of comfort will improve and/or be controlled Outcome: Progressing   Problem: Safety: Goal: Ability to remain free from injury will improve Outcome: Progressing

## 2023-01-31 NOTE — Plan of Care (Signed)
  Problem: Education: Goal: Knowledge of General Education information will improve Description: Including pain rating scale, medication(s)/side effects and non-pharmacologic comfort measures Outcome: Adequate for Discharge   Problem: Health Behavior/Discharge Planning: Goal: Ability to manage health-related needs will improve Outcome: Adequate for Discharge   Problem: Clinical Measurements: Goal: Ability to maintain clinical measurements within normal limits will improve Outcome: Adequate for Discharge Goal: Will remain free from infection Outcome: Adequate for Discharge Goal: Diagnostic test results will improve Outcome: Adequate for Discharge Goal: Respiratory complications will improve Outcome: Adequate for Discharge Goal: Cardiovascular complication will be avoided Outcome: Adequate for Discharge   Problem: Activity: Goal: Risk for activity intolerance will decrease 01/31/2023 1222 by Alice Rieger A, LPN Outcome: Adequate for Discharge 01/31/2023 0946 by Delila Spence, LPN Outcome: Progressing   Problem: Nutrition: Goal: Adequate nutrition will be maintained 01/31/2023 1222 by Delila Spence, LPN Outcome: Adequate for Discharge 01/31/2023 0946 by Delila Spence, LPN Outcome: Progressing   Problem: Coping: Goal: Level of anxiety will decrease 01/31/2023 1222 by Alice Rieger A, LPN Outcome: Adequate for Discharge 01/31/2023 0946 by Delila Spence, LPN Outcome: Progressing   Problem: Elimination: Goal: Will not experience complications related to bowel motility Outcome: Adequate for Discharge Goal: Will not experience complications related to urinary retention Outcome: Adequate for Discharge   Problem: Pain Managment: Goal: General experience of comfort will improve and/or be controlled Outcome: Adequate for Discharge   Problem: Safety: Goal: Ability to remain free from injury will improve Outcome: Adequate for Discharge   Problem: Skin  Integrity: Goal: Risk for impaired skin integrity will decrease Outcome: Adequate for Discharge   Problem: Education: Goal: Knowledge of the prescribed therapeutic regimen will improve Outcome: Adequate for Discharge Goal: Individualized Educational Video(s) Outcome: Adequate for Discharge   Problem: Activity: Goal: Ability to avoid complications of mobility impairment will improve Outcome: Adequate for Discharge Goal: Range of joint motion will improve Outcome: Adequate for Discharge   Problem: Clinical Measurements: Goal: Postoperative complications will be avoided or minimized Outcome: Adequate for Discharge   Problem: Pain Management: Goal: Pain level will decrease with appropriate interventions Outcome: Adequate for Discharge   Problem: Skin Integrity: Goal: Will show signs of wound healing Outcome: Adequate for Discharge

## 2023-02-02 ENCOUNTER — Encounter (HOSPITAL_COMMUNITY): Payer: Self-pay | Admitting: Orthopedic Surgery

## 2023-04-16 ENCOUNTER — Other Ambulatory Visit: Payer: Self-pay

## 2023-04-16 DIAGNOSIS — Z86718 Personal history of other venous thrombosis and embolism: Secondary | ICD-10-CM

## 2023-06-22 ENCOUNTER — Telehealth: Payer: Self-pay

## 2023-06-22 NOTE — Telephone Encounter (Signed)
 Per [employer], patient's insurance termed in 04/25.  Per patient, she is just coming off of FMLA/Disability.  She is tring to re-instate her insurance and will have Active insurance prior to her hospital procedure on 07/27/23.  Pt verbalized understanding that she must call the office with her new insurance info prior to procedure.

## 2023-08-17 ENCOUNTER — Encounter: Payer: Self-pay | Admitting: Vascular Surgery

## 2023-08-24 ENCOUNTER — Ambulatory Visit (HOSPITAL_COMMUNITY)
Admission: RE | Admit: 2023-08-24 | Discharge: 2023-08-24 | Disposition: A | Discharge status: Home / Self Care | Payer: Self-pay | Attending: Vascular Surgery | Admitting: Vascular Surgery

## 2023-08-24 ENCOUNTER — Encounter (HOSPITAL_COMMUNITY)
Admission: RE | Disposition: A | Discharge status: Home / Self Care | Payer: Self-pay | Source: Home / Self Care | Attending: Vascular Surgery

## 2023-08-24 ENCOUNTER — Other Ambulatory Visit: Payer: Self-pay

## 2023-08-24 DIAGNOSIS — Z86718 Personal history of other venous thrombosis and embolism: Secondary | ICD-10-CM | POA: Diagnosis not present

## 2023-08-24 DIAGNOSIS — Z87891 Personal history of nicotine dependence: Secondary | ICD-10-CM | POA: Diagnosis not present

## 2023-08-24 DIAGNOSIS — Z4589 Encounter for adjustment and management of other implanted devices: Secondary | ICD-10-CM | POA: Insufficient documentation

## 2023-08-24 DIAGNOSIS — Z96653 Presence of artificial knee joint, bilateral: Secondary | ICD-10-CM | POA: Insufficient documentation

## 2023-08-24 DIAGNOSIS — Z452 Encounter for adjustment and management of vascular access device: Secondary | ICD-10-CM

## 2023-08-24 DIAGNOSIS — Z86711 Personal history of pulmonary embolism: Secondary | ICD-10-CM | POA: Diagnosis not present

## 2023-08-24 DIAGNOSIS — Z7901 Long term (current) use of anticoagulants: Secondary | ICD-10-CM | POA: Diagnosis not present

## 2023-08-24 HISTORY — PX: IVC FILTER REMOVAL: CATH118246

## 2023-08-24 LAB — POCT I-STAT, CHEM 8
BUN: 17 mg/dL (ref 8–23)
Calcium, Ion: 1.16 mmol/L (ref 1.15–1.40)
Chloride: 106 mmol/L (ref 98–111)
Creatinine, Ser: 1.1 mg/dL — ABNORMAL HIGH (ref 0.44–1.00)
Glucose, Bld: 89 mg/dL (ref 70–99)
HCT: 34 % — ABNORMAL LOW (ref 36.0–46.0)
Hemoglobin: 11.6 g/dL — ABNORMAL LOW (ref 12.0–15.0)
Potassium: 4.3 mmol/L (ref 3.5–5.1)
Sodium: 142 mmol/L (ref 135–145)
TCO2: 26 mmol/L (ref 22–32)

## 2023-08-24 SURGERY — IVC FILTER REMOVAL
Anesthesia: LOCAL

## 2023-08-24 MED ORDER — MIDAZOLAM HCL 2 MG/2ML IJ SOLN
INTRAMUSCULAR | Status: DC | PRN
  Administered 2023-08-24 (×2): 1 mg via INTRAVENOUS

## 2023-08-24 MED ORDER — LIDOCAINE HCL (PF) 1 % IJ SOLN
INTRAMUSCULAR | Status: DC | PRN
Start: 2023-08-24 — End: 2023-08-24
  Administered 2023-08-24 (×2): 10 mL

## 2023-08-24 MED ORDER — FENTANYL CITRATE (PF) 100 MCG/2ML IJ SOLN
INTRAMUSCULAR | Status: DC | PRN
  Administered 2023-08-24 (×2): 50 ug via INTRAVENOUS

## 2023-08-24 MED ORDER — SODIUM CHLORIDE 0.9 % IV SOLN: INTRAVENOUS | Status: DC

## 2023-08-24 MED ORDER — ACETAMINOPHEN 325 MG PO TABS
650.0000 mg | ORAL_TABLET | ORAL | Status: DC | PRN
  Administered 2023-08-24 (×2): 650 mg via ORAL
  Filled 2023-08-24: qty 2

## 2023-08-24 MED ORDER — FENTANYL CITRATE (PF) 100 MCG/2ML IJ SOLN
INTRAMUSCULAR | Status: AC
  Filled 2023-08-24: qty 2

## 2023-08-24 MED ORDER — MIDAZOLAM HCL 2 MG/2ML IJ SOLN
INTRAMUSCULAR | Status: AC
  Filled 2023-08-24: qty 2

## 2023-08-24 MED ORDER — HEPARIN (PORCINE) IN NACL 1000-0.9 UT/500ML-% IV SOLN
INTRAVENOUS | Status: DC | PRN
  Administered 2023-08-24 (×2): 500 mL

## 2023-08-24 MED ORDER — LIDOCAINE HCL (PF) 1 % IJ SOLN
INTRAMUSCULAR | Status: AC
  Filled 2023-08-24: qty 30

## 2023-08-24 SURGICAL SUPPLY — 7 items
KIT MICROPUNCTURE NIT STIFF (SHEATH) IMPLANT
KIT SINGLE USE MANIFOLD (KITS) IMPLANT
KIT SYRINGE INJ CVI SPIKEX1 (MISCELLANEOUS) IMPLANT
PACK CARDIAC CATHETERIZATION (CUSTOM PROCEDURE TRAY) IMPLANT
SET CLOVERSNARE FLT RETRIEVAL (MISCELLANEOUS) IMPLANT
SHEATH PROBE COVER 6X72 (BAG) IMPLANT
WIRE BENTSON .035X145CM (WIRE) IMPLANT

## 2023-08-24 NOTE — H&P (Signed)
 H+P  History of Present Illness: This is a 63 y.o. 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  She has held this for 48 hours with plans for IVC filter removal.  She has undergone left total knee arthroplasty most recently prior to that underwent right total knee arthroplasty with no plans for further orthopedic procedures.  She is here today for IVC filter removal.  Past Medical History:  Diagnosis Date   Arthritis    Clotting disorder (HCC)    Complication of anesthesia    DVT (deep venous thrombosis) (HCC)    Ectopic pregnancy    Fibroids    Hyperlipidemia    PE (pulmonary embolism)    Peripheral vascular disease (HCC)    Pneumonia    PONV (postoperative nausea and vomiting)    Umbilical hernia     Past Surgical History:  Procedure Laterality Date   DIAGNOSTIC LAPAROSCOPY  2007,2012   ectopic preg   EYE SURGERY     cataract   IVC FILTER INSERTION N/A 01/26/2023   Procedure: IVC FILTER INSERTION;  Surgeon: Sheree Penne Bruckner, MD;  Location: [facility] INVASIVE CV LAB;  Service: Cardiovascular;  Laterality: N/A;   KNEE ARTHROPLASTY Right 12/13/2020   Procedure: COMPUTER ASSISTED TOTAL KNEE ARTHROPLASTY;  Surgeon: Fidel Rogue, MD;  Location: [facility]S;  Service: Orthopedics;  Laterality: Right;   KNEE ARTHROPLASTY Left 01/29/2023   Procedure: COMPUTER ASSISTED TOTAL KNEE ARTHROPLASTY;  Surgeon: Fidel Rogue, MD;  Location: [facility];  Service: Orthopedics;  Laterality: Left;  160   KNEE SURGERY  01/13/1998   right/ scope   macular degeneration Bilateral    UMBILICAL HERNIA REPAIR N/A 08/24/2013   Procedure: OPEN REPAIR OF INCISIONAL UMBILCAL HERNIA;  Surgeon: Elon CHRISTELLA Pacini, MD;  Location: [facility];  Service: General;  Laterality: N/A;   WISDOM TOOTH EXTRACTION      Allergies  Allergen Reactions   Lyrica [Pregabalin] Shortness Of Breath   Tramadol Nausea Only    Prior to Admission medications   Medication  Sig Start Date End Date Taking? Authorizing Provider  apixaban  (ELIQUIS ) 5 MG TABS tablet Take 5 mg by mouth 2 (two) times daily.   Yes [provider]    Social History   Socioeconomic History   Marital status: Single    Spouse name: Not on file   Number of children: Not on file   Years of education: Not on file   Highest education level: Not on file  Occupational History   Not on file  Tobacco Use   Smoking status: Former    Current packs/day: 0.00    Types: Cigarettes    Quit date: 08/09/2003    Years since quitting: 20.0   Smokeless tobacco: Never  Vaping Use   Vaping status: Never Used  Substance and Sexual Activity   Alcohol  use: Not Currently    Comment: rare   Drug use: No   Sexual activity: Not on file  Other Topics Concern   Not on file  Social History Narrative   Not on file   Social Drivers of Health   Financial Resource Strain: Low Risk  (04/07/2023)   Received from [facility]   Overall Financial Resource Strain (CARDIA)    Difficulty of Paying Living Expenses: Not hard at all  Food Insecurity: No Food Insecurity (04/07/2023)   Received from [facility]   Hunger Vital Sign    Within the past 12 months, you worried that your  food would run out before you got the money to buy more.: Never true    Within the past 12 months, the food you bought just didn't last and you didn't have money to get more.: Never true  Transportation Needs: No Transportation Needs (04/07/2023)   Received from [facility]   PRAPARE - Transportation    Lack of Transportation (Medical): No    Lack of Transportation (Non-Medical): No  Physical Activity: Unknown (04/07/2023)   Received from [facility]   Exercise Vital Sign    On average, how many days per week do you engage in moderate to strenuous exercise (like a brisk walk)?: 0 days    Minutes of Exercise per Session: Not on file  Stress: No Stress Concern Present (04/07/2023)   Received from [facility] - Occupational Stress Questionnaire    Feeling of Stress : Not at all  Social Connections: Moderately Integrated (04/07/2023)   Received from [facility]   Social Network    How would you rate your social network (family, work, friends)?: Adequate participation with social networks  Intimate Partner Violence: Not At Risk (04/07/2023)   Received from [facility]   HITS    Over the last 12 months how often did your partner physically hurt you?: Never    Over the last 12 months how often did your partner insult you or talk down to you?: Never    Over the last 12 months how often did your partner threaten you with physical harm?: Never    Over the last 12 months how often did your partner scream or curse at you?: Never     Family History  Problem Relation Age of Onset   Rheum arthritis Mother    Cancer Father     ROS: Mild left knee pain   Physical Examination  Vitals:   08/24/23 0605  BP: (!) 142/70  Pulse: 80  Resp: 17  Temp: 98.5 F (36.9 C)  SpO2: 97%   Body mass index is 40.35 kg/m.  Awake alert oriented Nonlabored respirations   CBC    Component Value Date/Time   WBC 5.9 01/31/2023 0334   RBC 3.45 (L) 01/31/2023 0334   HGB 11.6 (L) 08/24/2023 0555   HGB 12.3 11/26/2009 1513   HCT 34.0 (L) 08/24/2023 0555   HCT 36.6 11/26/2009 1513   PLT 237 01/31/2023 0334   PLT 196 11/26/2009 1513   MCV 93.3 01/31/2023 0334   MCV 93 11/26/2009 1513   MCH 29.6 01/31/2023 0334   MCHC 31.7 01/31/2023 0334   RDW 15.6 (H) 01/31/2023 0334   RDW 11.5 11/26/2009 1513   LYMPHSABS 1.9 08/23/2013 1410   LYMPHSABS 2.1 11/26/2009 1513   MONOABS 0.3 08/23/2013 1410   EOSABS 0.1 08/23/2013 1410   EOSABS 0.2 11/26/2009 1513   BASOSABS 0.0 08/23/2013 1410   BASOSABS 0.0 11/26/2009 1513    BMET    Component Value Date/Time   NA 142 08/24/2023 0555   K 4.3 08/24/2023 0555   CL 106 08/24/2023 0555   CO2 24 01/31/2023 0334   GLUCOSE  89 08/24/2023 0555   BUN 17 08/24/2023 0555   CREATININE 1.10 (H) 08/24/2023 0555   CALCIUM 8.4 (L) 01/31/2023 0334   GFRNONAA >60 01/31/2023 0334   GFRAA 81 (L) 08/23/2013 1410    COAGS: Lab Results  Component Value Date   INR 1.0 11/30/2020   INR 1.7 (H) 01/26/2020   INR 1.36 08/23/2013  PROTIME 13.5 07/03/2008     Non-Invasive Vascular Imaging:   Previous venogram reviewed  ASSESSMENT/PLAN: This is a 63 y.o. female history of DVT with PE maintained on Eliquis.  Plan for IVC filter removal today as there are no further planned orthopedic procedures.  We discussed the risk and benefits as well as possible need to keep IVC filter if this does not remove easily.  She demonstrates good understanding.  If all goes as planned today she can resume Eliquis tomorrow.  Advit Trethewey C. Sheree, MD [facility] [place] Office: [phone] Pager: [phone]

## 2023-08-24 NOTE — Op Note (Signed)
    Patient name: [name] MRN: [id number] DOB: 03-14-1960 Sex: female  08/24/2023 Pre-operative Diagnosis: History of IVC filter placement Post-operative diagnosis:  Same Surgeon:  Penne BROCKS. Sheree, MD Procedure Performed: 1.  Percutaneous ultrasound-guided cannulation right internal jugular vein 2.  IVC filter retrieval 3.  Moderate sedation Versed  12 minutes 4.  Central venogram  Indications:  63 year old female with extensive history of DVT and PE now status post bilateral total knee arthroplasty.  She has a history of an IVC filter placement maintained on Eliquis  which has been held for 48 hours prior to this procedure.  She is indicated for filter retrieval and we have discussed the risk benefits alternatives she demonstrates good understanding.  Findings: The IVC filter was in the midline was easily removed entirely intact in the IVC was patent without any interruption and or extravasation at completion.   Procedure:  The patient was identified in the holding area and taken to room 8.  The patient was then placed supine on the table and prepped and draped in the usual sterile fashion.  A time out was called.  Ultrasound was used to evaluate the right internal jugular vein which was large, patent and compressible.  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  The area was anesthetized 1% lidocaine  and cannulated with micropuncture needle followed by wire and sheath.  Concomitantly we administered fentanyl  and Versed  with moderate sedation and her vital signs were monitored by bedside nursing throughout the case.  A Bentson wire was placed under fluoroscopic guidance into the IVC and the retrieval sheath was then placed also under fluoroscopic guidance.  The filter was snared and the sheath was advanced over the filter and filter was removed and determined intact.  Completion venogram demonstrated patency of the IVC without any  extravasation.  Satisfied at this sheath was removed and pressure was held obtained.  She tolerated the procedure without any complication.   Contrast: 10 cc  Deakin Lacek C. Sheree, MD [facility] [place] Office: [phone] Pager: [phone]

## 2023-08-25 ENCOUNTER — Encounter (HOSPITAL_COMMUNITY): Payer: Self-pay | Admitting: Vascular Surgery

## 2024-01-18 DIAGNOSIS — E042 Nontoxic multinodular goiter: Secondary | ICD-10-CM

## 2024-02-03 ENCOUNTER — Other Ambulatory Visit: Payer: Self-pay | Admitting: Pediatrics

## 2024-02-03 DIAGNOSIS — E042 Nontoxic multinodular goiter: Secondary | ICD-10-CM
